# Patient Record
Sex: Female | Born: 1960 | Race: White | Hispanic: No | Marital: Single | Smoking: Former smoker
Health system: Southern US, Community
[De-identification: ages and names within clinical notes are randomized; demographics above are authoritative.]

## PROBLEM LIST (undated history)

## (undated) DIAGNOSIS — I10 Essential (primary) hypertension: Secondary | ICD-10-CM

## (undated) HISTORY — PX: CHOLECYSTECTOMY: SHX55

---

## 1997-11-03 ENCOUNTER — Emergency Department (HOSPITAL_COMMUNITY): Admission: EM | Admit: 1997-11-03 | Discharge: 1997-11-03 | Payer: Self-pay | Admitting: Emergency Medicine

## 1998-09-29 ENCOUNTER — Emergency Department (HOSPITAL_COMMUNITY): Admission: EM | Admit: 1998-09-29 | Discharge: 1998-09-29 | Payer: Self-pay | Admitting: Emergency Medicine

## 1998-10-04 ENCOUNTER — Other Ambulatory Visit: Admission: RE | Admit: 1998-10-04 | Discharge: 1998-10-04 | Payer: Self-pay | Admitting: Obstetrics and Gynecology

## 1998-12-16 ENCOUNTER — Emergency Department (HOSPITAL_COMMUNITY): Admission: EM | Admit: 1998-12-16 | Discharge: 1998-12-16 | Payer: Self-pay | Admitting: Emergency Medicine

## 1999-01-01 ENCOUNTER — Ambulatory Visit (HOSPITAL_COMMUNITY): Admission: RE | Admit: 1999-01-01 | Discharge: 1999-01-01 | Payer: Self-pay | Admitting: Gastroenterology

## 1999-01-16 ENCOUNTER — Observation Stay (HOSPITAL_COMMUNITY): Admission: RE | Admit: 1999-01-16 | Discharge: 1999-01-17 | Payer: Self-pay | Admitting: General Surgery

## 2000-04-08 ENCOUNTER — Other Ambulatory Visit: Admission: RE | Admit: 2000-04-08 | Discharge: 2000-04-08 | Payer: Self-pay | Admitting: Gynecology

## 2002-04-06 ENCOUNTER — Other Ambulatory Visit: Admission: RE | Admit: 2002-04-06 | Discharge: 2002-04-06 | Payer: Self-pay | Admitting: Gynecology

## 2003-04-15 ENCOUNTER — Ambulatory Visit (HOSPITAL_BASED_OUTPATIENT_CLINIC_OR_DEPARTMENT_OTHER): Admission: RE | Admit: 2003-04-15 | Discharge: 2003-04-15 | Payer: Self-pay | Admitting: Gynecology

## 2003-04-15 ENCOUNTER — Ambulatory Visit (HOSPITAL_COMMUNITY): Admission: RE | Admit: 2003-04-15 | Discharge: 2003-04-15 | Payer: Self-pay | Admitting: Gynecology

## 2005-07-22 ENCOUNTER — Other Ambulatory Visit: Admission: RE | Admit: 2005-07-22 | Discharge: 2005-07-22 | Payer: Self-pay | Admitting: Gynecology

## 2005-11-20 ENCOUNTER — Emergency Department (HOSPITAL_COMMUNITY): Admission: EM | Admit: 2005-11-20 | Discharge: 2005-11-20 | Payer: Self-pay | Admitting: Emergency Medicine

## 2010-12-23 ENCOUNTER — Emergency Department: Payer: Self-pay | Admitting: Internal Medicine

## 2011-03-30 ENCOUNTER — Other Ambulatory Visit: Payer: Self-pay | Admitting: Internal Medicine

## 2011-03-30 ENCOUNTER — Other Ambulatory Visit: Payer: Self-pay | Admitting: Physician Assistant

## 2011-07-01 ENCOUNTER — Other Ambulatory Visit: Payer: Self-pay | Admitting: Physician Assistant

## 2011-07-02 ENCOUNTER — Other Ambulatory Visit: Payer: Self-pay | Admitting: Physician Assistant

## 2011-10-31 ENCOUNTER — Telehealth: Payer: Self-pay

## 2011-10-31 NOTE — Telephone Encounter (Signed)
Called patient she is advised to have pharmacy send over the request to ensure correct meds are sent for her.  If she needs anything else, she will call me

## 2011-10-31 NOTE — Telephone Encounter (Signed)
[phone]   Pt is unemployed, no insurance and would like for Dr. Merla Riches to prescribe her refills on PARoxetine (PAXIL) 20 MG tablet simvastatin (ZOCOR) 40 MG tablet And hydrocloize   [facility] - [place]

## 2013-07-28 ENCOUNTER — Ambulatory Visit: Payer: Self-pay | Admitting: Internal Medicine

## 2014-05-22 NOTE — Consult Note (Signed)
PATIENT NAME:  Hailey DeltonMOSER, BECKY MR#:  [id number] DATE OF BIRTH:  1960-12-21  DATE OF CONSULTATION:  12/23/2010  REFERRING PHYSICIAN:  Dr. Mindi JunkerGottlieb  CONSULTING PHYSICIAN:  Winn JockJames C. Jesus Nevills, MD  REASON FOR CONSULTATION: The patient is a 54 year old female with pain, swelling, and possible bite to her right upper extremity.   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 She developed rather rapid pain and swelling with some nausea. She presented to the Emergency Room. She denied any prior such issues. She's been fairly comfortable in the Emergency Room after given IV Solu-Medrol.   Her nausea has also resolved with Zofran.   She denies any numbness or tingling in the hand. Pain is primarily in the dorsum of the hand with some swelling in the forearm.   PAST MEDICAL HISTORY: Hypertension.   DRUG ALLERGIES: None.   MEDICATIONS:  1. Hydrochlorothiazide.  2. Xanax.  REVIEW OF SYSTEMS: Unremarkable for any fevers, chills, or constitutional symptoms except as noted above. No bowel or bladder symptoms.   SOCIAL HISTORY: The patient drinks wine. Does not smoke. No illicit drugs.   FAMILY HISTORY: Noncontributory.   PHYSICAL EXAMINATION: The patient is alert and oriented with arm hanging in a fingertrap type device. There is swelling throughout the hand but more so on the dorsum and in the thenar area. The webspace between the thumb and index finger reveals moderate swelling. There is what appears to be a small puncture wound with minimal erythema. There is mild swelling in the forearm with tenderness. Passive range of motion causes mild pain. Active range of motion is more painful. Neurovascular examination is intact. Pulses are intact. No axillary nodes. No epitrochlear nodes. Shoulder and elbow have good range of motion. Neck is supple.   LABORATORY, DIAGNOSTIC,  AND RADIOLOGICAL DATA: Laboratory results are reviewed. Hemogram revealed a white blood cell count of 14.4.   CLINICAL IMPRESSION: Insect bite versus snakebite to right hand. The patient is feeling better. Swelling has not progressed. She desires to go home. She understands to return if she has any increased pain, swelling, numbness, or tingling. She will be given IV Toradol prior to discharge and prescription for Toradol. Will take Tylenol. Clear discharge instructions were given. A modified sling is prepared for her to keep the arm elevated. She will call if she has any questions. She is given my card. She understands if this is to worsen she is to return to the Emergency Room as soon as possible.   ____________________________ Winn JockJames C. Gerrit Heckaliff, MD jcc:drc D: 12/23/2010 21:34:42 ET T: 12/24/2010 08:25:47 ET JOB#: [id number]  cc: Winn JockJames C. Gerrit Heckaliff, MD, <Dictator> Winn JockJAMES C Deshan Hemmelgarn MD ELECTRONICALLY SIGNED 02/14/2011 16:00

## 2015-02-21 ENCOUNTER — Encounter (HOSPITAL_COMMUNITY): Payer: Self-pay | Admitting: Emergency Medicine

## 2015-02-21 ENCOUNTER — Emergency Department (HOSPITAL_COMMUNITY)
Admission: EM | Admit: 2015-02-21 | Discharge: 2015-02-21 | Disposition: A | Discharge status: Home / Self Care | Payer: BLUE CROSS/BLUE SHIELD | Attending: Emergency Medicine | Admitting: Emergency Medicine

## 2015-02-21 ENCOUNTER — Emergency Department (HOSPITAL_COMMUNITY): Payer: BLUE CROSS/BLUE SHIELD

## 2015-02-21 DIAGNOSIS — R202 Paresthesia of skin: Secondary | ICD-10-CM | POA: Diagnosis not present

## 2015-02-21 DIAGNOSIS — Z79899 Other long term (current) drug therapy: Secondary | ICD-10-CM | POA: Diagnosis not present

## 2015-02-21 DIAGNOSIS — R0789 Other chest pain: Secondary | ICD-10-CM | POA: Diagnosis not present

## 2015-02-21 DIAGNOSIS — I10 Essential (primary) hypertension: Secondary | ICD-10-CM | POA: Insufficient documentation

## 2015-02-21 DIAGNOSIS — R079 Chest pain, unspecified: Secondary | ICD-10-CM | POA: Diagnosis present

## 2015-02-21 HISTORY — DX: Essential (primary) hypertension: I10

## 2015-02-21 LAB — I-STAT TROPONIN, ED: Troponin i, poc: 0.01 ng/mL (ref 0.00–0.08)

## 2015-02-21 LAB — CBC
HCT: 39 % (ref 36.0–46.0)
Hemoglobin: 13.4 g/dL (ref 12.0–15.0)
MCH: 31.1 pg (ref 26.0–34.0)
MCHC: 34.4 g/dL (ref 30.0–36.0)
MCV: 90.5 fL (ref 78.0–100.0)
Platelets: 269 10*3/uL (ref 150–400)
RBC: 4.31 MIL/uL (ref 3.87–5.11)
RDW: 12.9 % (ref 11.5–15.5)
WBC: 5.4 10*3/uL (ref 4.0–10.5)

## 2015-02-21 LAB — BASIC METABOLIC PANEL
Anion gap: 10 (ref 5–15)
BUN: 14 mg/dL (ref 6–20)
CO2: 26 mmol/L (ref 22–32)
Calcium: 9.9 mg/dL (ref 8.9–10.3)
Chloride: 104 mmol/L (ref 101–111)
Creatinine, Ser: 0.63 mg/dL (ref 0.44–1.00)
GFR calc Af Amer: >60 mL/min (ref >60)
GFR calc non Af Amer: >60 mL/min (ref >60)
Glucose, Bld: 99 mg/dL (ref 65–99)
Potassium: 3.8 mmol/L (ref 3.5–5.1)
Sodium: 140 mmol/L (ref 135–145)

## 2015-02-21 NOTE — ED Provider Notes (Signed)
CSN: [id number]     Arrival date & time 02/21/15  1121 History   First MD Initiated Contact with Patient 02/21/15 1533     Chief Complaint  Patient presents with  . Chest Pain     (Consider location/radiation/quality/duration/timing/severity/associated sxs/prior Treatment) HPI   55 year old female with intermittent paresthesias to bilateral upper extremities. Right more than left. Typically from right elbow down into the hand and also in left shoulder distally. Happens intermittently. Seems to notice more at night when laying in bed. Describes pins and needle sensation and that feels "asleep." Denies any neck or back pain. No weakness. Symptoms have been ongoing for the last week or so. 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 She has worked here for approximately past 2-1/2 years. She denies any acute trauma or change in activity. Also describes intermittent tightness around her epigastrium/lower chest. She feels like something is wrapped circumferentially around her and squeezing. Episodes last a few minutes. She has not noticed any appreciable exacerbating relieving factors with regards to this either.  Maybe notices it more at night when in bed? When this happens she feels like she cannot get a deep breath. No cough. No fevers or chills. No unusual leg pain or swelling. Hx of anxiety but says that these are not typical symptoms for her. Increased stress particularly husband with stroke.   Past Medical History  Diagnosis Date  . Hypertension    Past Surgical History  Procedure Laterality Date  . Cholecystectomy     No family history on file. Social History  Substance Use Topics  . Smoking status: Never Smoker   . Smokeless tobacco: None  . Alcohol Use: No   OB History    No data available     Review of Systems  All systems reviewed and negative, other than as noted in HPI.   Allergies  Review of patient's allergies indicates not on  file.  Home Medications   Prior to Admission medications   Medication Sig Start Date End Date Taking? Authorizing Provider  ALPRAZolam Prudy Feeler) 0.5 MG tablet TAKE ONE TABLET BY MOUTH AT BEDTIME AS NEEDED 07/02/11   Pattricia Boss, PA-C  PARoxetine (PAXIL) 20 MG tablet TAKE ONE TABLET BY MOUTH AT BEDTIME 07/01/11   Pattricia Boss, PA-C  simvastatin (ZOCOR) 40 MG tablet TAKE ONE TABLET BY MOUTH EVERY DAY 07/01/11   Pattricia Boss, PA-C   BP 151/78 mmHg  Pulse 65  Temp(Src) 97.8 F (36.6 C) (Oral)  Resp 18  Ht  (1.626 m)  Wt 140 lb (63.504 kg)  BMI 24.02 kg/m2  SpO2 100% Physical Exam  Constitutional: She is oriented to person, place, and time. She appears well-developed and well-nourished. No distress.  HENT:  Head: Normocephalic and atraumatic.  Eyes: Conjunctivae are normal. Right eye exhibits no discharge. Left eye exhibits no discharge.  Neck: Neck supple.  Cardiovascular: Normal rate, regular rhythm and normal heart sounds.  Exam reveals no gallop and no friction rub.   No murmur heard. Pulmonary/Chest: Effort normal and breath sounds normal. No respiratory distress.  Abdominal: Soft. She exhibits no distension. There is no tenderness.  Musculoskeletal: She exhibits no edema or tenderness.  Lower extremities symmetric as compared to each other. No calf tenderness. Negative Homan's. No palpable cords.   Neurological: She is alert and oriented to person, place, and time. No cranial nerve deficit. She exhibits normal muscle tone. Coordination normal.  Cranial nerves II through XII are intact.  Strength is 5 out of 5 bilateral upper extremities. Sensation is intact to light touch. Normal range of motion at the wrists, elbows and shoulder without any apparent discomfort. No rash, swelling or concerning lesions.  Skin: Skin is warm and dry.  Psychiatric: She has a normal mood and affect. Her behavior is normal. Thought content normal.  Nursing note and vitals reviewed.   ED  Course  Procedures (including critical care time) Labs Review Labs Reviewed  BASIC METABOLIC PANEL  CBC  I-STAT TROPOININ, ED    Imaging Review Dg Chest 2 View  02/21/2015  CLINICAL DATA:  Left shoulder pain and tightness in the chest since Saturday. EXAM: CHEST  2 VIEW COMPARISON:  11/20/2005 FINDINGS: Mild chronic enlargement of the cardiopericardial silhouette. No edema. The lungs appear clear. Deformity favoring healed fracture of the right lateral eighth rib. Thoracic spondylosis.  No pleural effusion. IMPRESSION: 1. Mild chronic enlargement of the cardiopericardial silhouette, without edema. The lungs appear clear. 2. Chronic deformity of the right lateral eighth rib favoring healed fracture. 3. Thoracic spondylosis. Electronically Signed   By: Gaylyn Rong M.D.   On: 02/21/2015 13:06   I have personally reviewed and evaluated these images and lab results as part of my medical decision-making.   EKG Interpretation   Date/Time:  Tuesday February 21 2015 11:25:09 EST Ventricular Rate:  65 PR Interval:  166 QRS Duration: 84 QT Interval:  366 QTC Calculation: 380 R Axis:   52 Text Interpretation:  Normal sinus rhythm Normal ECG No old tracing to  compare Confirmed by Judea Fennimore  MD, Devita Nies ([id number]) on 02/21/2015 3:38:16 PM      MDM   Final diagnoses:  Paresthesia of upper extremity  Chest tightness or pressure    54 with intermittent paresthesias of bilateral upper extremities. Suspect peripheral etiology. Doubt central cause. Neuro exam is nonfocal. 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 Also describes intermittent tightness around epigastrium/lower chest. Atypical for ACS. Actually notices symptoms more while at rest. My suspicion for ACS or other emergent etiology is low. Has hx of HTN, HLD and middle aged though. Recommend further discussion with PCP in terms of possible stress testing.    Raeford Razor, MD 02/21/15 [phone]

## 2015-02-21 NOTE — Discharge Instructions (Signed)
Paresthesia Paresthesia is an abnormal burning or prickling sensation. This sensation is generally felt in the hands, arms, legs, or feet. However, it may occur in any part of the body. Usually, it is not painful. The feeling may be described as:  Tingling or numbness.  Pins and needles.  Skin crawling.  Buzzing.  Limbs falling asleep.  Itching. Most people experience temporary (transient) paresthesia at some time in their lives. Paresthesia may occur when you breathe too quickly (hyperventilation). It can also occur without any apparent cause. Commonly, paresthesia occurs when pressure is placed on a nerve. The sensation quickly goes away after the pressure is removed. For some people, however, paresthesia is a long-lasting (chronic) condition that is caused by an underlying disorder. If you continue to have paresthesia, you may need further medical evaluation. HOME CARE INSTRUCTIONS Watch your condition for any changes. Taking the following actions may help to lessen any discomfort that you are feeling:  Avoid drinking alcohol.  Try acupuncture or massage to help relieve your symptoms.  Keep all follow-up visits as directed by your health care provider. This is important. SEEK MEDICAL CARE IF:  You continue to have episodes of paresthesia.  Your burning or prickling feeling gets worse when you walk.  You have pain, cramps, or dizziness.  You develop a rash. SEEK IMMEDIATE MEDICAL CARE IF:  You feel weak.  You have trouble walking or moving.  You have problems with speech, understanding, or vision.  You feel confused.  You cannot control your bladder or bowel movements.  You have numbness after an injury.  You faint.   This information is not intended to replace advice given to you by your health care provider. Make sure you discuss any questions you have with your health care provider.   Document Released: 01/04/2002 Document Revised: 05/31/2014 Document Reviewed:  01/10/2014 Elsevier Interactive Patient Education 2016 Elsevier Inc.  Nonspecific Chest Pain  Chest pain can be caused by many different conditions. There is always a chance that your pain could be related to something serious, such as a heart attack or a blood clot in your lungs. Chest pain can also be caused by conditions that are not life-threatening. If you have chest pain, it is very important to follow up with your health care provider. CAUSES  Chest pain can be caused by:  Heartburn.  Pneumonia or bronchitis.  Anxiety or stress.  Inflammation around your heart (pericarditis) or lung (pleuritis or pleurisy).  A blood clot in your lung.  A collapsed lung (pneumothorax). It can develop suddenly on its own (spontaneous pneumothorax) or from trauma to the chest.  Shingles infection (varicella-zoster virus).  Heart attack.  Damage to the bones, muscles, and cartilage that make up your chest wall. This can include:  Bruised bones due to injury.  Strained muscles or cartilage due to frequent or repeated coughing or overwork.  Fracture to one or more ribs.  Sore cartilage due to inflammation (costochondritis). RISK FACTORS  Risk factors for chest pain may include:  Activities that increase your risk for trauma or injury to your chest.  Respiratory infections or conditions that cause frequent coughing.  Medical conditions or overeating that can cause heartburn.  Heart disease or family history of heart disease.  Conditions or health behaviors that increase your risk of developing a blood clot.  Having had chicken pox (varicella zoster). SIGNS AND SYMPTOMS Chest pain can feel like:  Burning or tingling on the surface of your chest or deep in your  chest.  Crushing, pressure, aching, or squeezing pain.  Dull or sharp pain that is worse when you move, cough, or take a deep breath.  Pain that is also felt in your back, neck, shoulder, or arm, or pain that spreads to  any of these areas. Your chest pain may come and go, or it may stay constant. DIAGNOSIS Lab tests or other studies may be needed to find the cause of your pain. Your health care provider may have you take a test called an ambulatory ECG (electrocardiogram). An ECG records your heartbeat patterns at the time the test is performed. You may also have other tests, such as:  Transthoracic echocardiogram (TTE). During echocardiography, sound waves are used to create a picture of all of the heart structures and to look at how blood flows through your heart.  Transesophageal echocardiogram (TEE).This is a more advanced imaging test that obtains images from inside your body. It allows your health care provider to see your heart in finer detail.  Cardiac monitoring. This allows your health care provider to monitor your heart rate and rhythm in real time.  Holter monitor. This is a portable device that records your heartbeat and can help to diagnose abnormal heartbeats. It allows your health care provider to track your heart activity for several days, if needed.  Stress tests. These can be done through exercise or by taking medicine that makes your heart beat more quickly.  Blood tests.  Imaging tests. TREATMENT  Your treatment depends on what is causing your chest pain. Treatment may include:  Medicines. These may include:  Acid blockers for heartburn.  Anti-inflammatory medicine.  Pain medicine for inflammatory conditions.  Antibiotic medicine, if an infection is present.  Medicines to dissolve blood clots.  Medicines to treat coronary artery disease.  Supportive care for conditions that do not require medicines. This may include:  Resting.  Applying heat or cold packs to injured areas.  Limiting activities until pain decreases. HOME CARE INSTRUCTIONS  If you were prescribed an antibiotic medicine, finish it all even if you start to feel better.  Avoid any activities that bring  on chest pain.  Do not use any tobacco products, including cigarettes, chewing tobacco, or electronic cigarettes. If you need help quitting, ask your health care provider.  Do not drink alcohol.  Take medicines only as directed by your health care provider.  Keep all follow-up visits as directed by your health care provider. This is important. This includes any further testing if your chest pain does not go away.  If heartburn is the cause for your chest pain, you may be told to keep your head raised (elevated) while sleeping. This reduces the chance that acid will go from your stomach into your esophagus.  Make lifestyle changes as directed by your health care provider. These may include:  Getting regular exercise. Ask your health care provider to suggest some activities that are safe for you.  Eating a heart-healthy diet. A registered dietitian can help you to learn healthy eating options.  Maintaining a healthy weight.  Managing diabetes, if necessary.  Reducing stress. SEEK MEDICAL CARE IF:  Your chest pain does not go away after treatment.  You have a rash with blisters on your chest.  You have a fever. SEEK IMMEDIATE MEDICAL CARE IF:   Your chest pain is worse.  You have an increasing cough, or you cough up blood.  You have severe abdominal pain.  You have severe weakness.  You faint.  You have chills.  You have sudden, unexplained chest discomfort.  You have sudden, unexplained discomfort in your arms, back, neck, or jaw.  You have shortness of breath at any time.  You suddenly start to sweat, or your skin gets clammy.  You feel nauseous or you vomit.  You suddenly feel light-headed or dizzy.  Your heart begins to beat quickly, or it feels like it is skipping beats. These symptoms may represent a serious problem that is an emergency. Do not wait to see if the symptoms will go away. Get medical help right away. Call your local emergency services (911 in  the [place] Do not drive yourself to the hospital.   This information is not intended to replace advice given to you by your health care provider. Make sure you discuss any questions you have with your health care provider.   Document Released: 10/24/2004 Document Revised: 02/04/2014 Document Reviewed: 08/20/2013 Elsevier Interactive Patient Education [employer]

## 2015-02-21 NOTE — ED Notes (Signed)
C/o chest pain and SOB X several days, no other complaints, A/O X4, ambulatory and in NAD

## 2015-03-01 ENCOUNTER — Inpatient Hospital Stay: Payer: Self-pay | Admitting: Family Medicine

## 2015-03-22 ENCOUNTER — Encounter: Payer: Self-pay | Admitting: Family Medicine

## 2015-03-22 ENCOUNTER — Ambulatory Visit (INDEPENDENT_AMBULATORY_CARE_PROVIDER_SITE_OTHER): Payer: BLUE CROSS/BLUE SHIELD | Admitting: Family Medicine

## 2015-03-22 VITALS — BP 126/79 | HR 63 | Temp 97.8°F | Resp 16 | Ht 63.0 in | Wt 152.0 lb

## 2015-03-22 DIAGNOSIS — F32A Depression, unspecified: Secondary | ICD-10-CM

## 2015-03-22 DIAGNOSIS — M791 Myalgia, unspecified site: Secondary | ICD-10-CM

## 2015-03-22 DIAGNOSIS — M7989 Other specified soft tissue disorders: Secondary | ICD-10-CM

## 2015-03-22 DIAGNOSIS — I10 Essential (primary) hypertension: Secondary | ICD-10-CM | POA: Diagnosis not present

## 2015-03-22 DIAGNOSIS — R232 Flushing: Secondary | ICD-10-CM

## 2015-03-22 DIAGNOSIS — F329 Major depressive disorder, single episode, unspecified: Secondary | ICD-10-CM | POA: Diagnosis not present

## 2015-03-22 DIAGNOSIS — Z23 Encounter for immunization: Secondary | ICD-10-CM

## 2015-03-22 MED ORDER — PAROXETINE HCL 20 MG PO TABS: 20.0000 mg | ORAL_TABLET | Freq: Every day | ORAL | Status: AC

## 2015-03-22 MED ORDER — HYDROCHLOROTHIAZIDE 12.5 MG PO CAPS: 12.5000 mg | ORAL_CAPSULE | Freq: Every day | ORAL | Status: DC

## 2015-03-22 NOTE — Progress Notes (Signed)
Subjective:    Patient ID: Hailey Bush, female    DOB: 1960/10/16, 55 y.o.   MRN: [id number]  HPI This is a pleasant 55 yo female who presents today for follow up of ED visit from 02/21/15. She was seen for arm pain/numbness. She has not had any more symptoms. Normal EKG/chest Xray/treponin. 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 Work schedule can be up to 14 hours a night. She is not up to date on health maintenance but is interested in having more regular care.    Over the last 6 months has had intermittent periods of flushing over bridge of nose. Does not coincide with hot flashes. Has noticed several times a month. Has had more muscle and joint aches. "bones hurt." Right hand swelling with work. Takes Advil, 2 tablets several times a week with some relief.   Has noticed hot flashes, night sweats, mood swings and sleep disturbance for about 1 year. Was on Xanax which made her feel groggy in the mornings. Has had increased stress with her husband's recent stroke, then he lost his job. She is not happy in her job and plans to look for a different job once her husband gets a new job. She has been taking Paxil, but not very regularly. Needs refill, thinks this will help her once she is taking regularly.   Past Medical History  Diagnosis Date  . Hypertension    Past Surgical History  Procedure Laterality Date  . Cholecystectomy     No family history on file. Social History  Substance Use Topics  . Smoking status: Former Smoker -- 8 years    Types: Cigarettes    Quit date: 04/29/2014  . Smokeless tobacco: None     Comment: smoked only 2 per day  . Alcohol Use: No   Review of Systems  Constitutional: Positive for fatigue. Negative for fever.  Respiratory: Negative for cough, chest tightness, shortness of breath and wheezing.   Cardiovascular: Negative for chest pain, palpitations and leg swelling.  Musculoskeletal: Positive for myalgias,  joint swelling and arthralgias.  Psychiatric/Behavioral: The patient is nervous/anxious.        Objective:   Physical Exam Physical Exam  Constitutional: Oriented to person, place, and time. She appears well-developed and well-nourished.  HENT:  Head: Normocephalic and atraumatic.  Eyes: Conjunctivae are normal.  Neck: Normal range of motion. Neck supple.  Cardiovascular: Normal rate, regular rhythm and normal heart sounds.   Pulmonary/Chest: Effort normal and breath sounds normal.  Musculoskeletal: Normal range of motion. No edema or erythema of hands. Normal strength.  Neurological: Alert and oriented to person, place, and time.  Skin: Skin is warm and dry.  Psychiatric: Normal mood and affect. Behavior is normal. Judgment and thought content normal.  Vitals reviewed.  BP 126/79 mmHg  Pulse 63  Temp(Src) 97.8 F (36.6 C) (Oral)  Resp 16  Ht  (1.6 m)  Wt 152 lb (68.947 kg)  BMI 26.93 kg/m2  SpO2 99% Wt Readings from Last 3 Encounters:  03/22/15 152 lb (68.947 kg)  02/21/15 140 lb (63.504 kg)       Assessment & Plan:  1. Need for prophylactic vaccination and inoculation against influenza - Flu Vaccine QUAD 36+ mos IM  2. Facial flushing - ANA  3. Myalgia - ANA - Sedimentation Rate - Rheumatoid factor  4. Swelling of right hand - this is intermittent and related to tasks at work, discussed  otc bracing and occasional NSAID use  5. Depression - discussed importance for good sleep hygiene, suggested she try melatonin for two weeks - PARoxetine (PAXIL) 20 MG tablet; Take 1 tablet (20 mg total) by mouth at bedtime.  Dispense: 90 tablet; Refill: 3  6. Essential hypertension - hydrochlorothiazide (MICROZIDE) 12.5 MG capsule; Take 1 capsule (12.5 mg total) by mouth daily.  Dispense: 90 capsule; Refill: 3  - she will follow up in 2-3 months for CPE  Olean Ree, FNP-BC  [facility] [place], [facility]  03/25/2015 9:56 AM

## 2015-03-22 NOTE — Patient Instructions (Signed)
We recommend that you schedule a mammogram for breast cancer screening. Typically, you do not need a referral to do this. Please contact a local imaging center to schedule your mammogram.  [facility] - [phone]  *ask for the Radiology Department [facility]) - [phone] or [phone]  [facility] - [phone] [facility] - [phone] [facility] - [phone]  *ask for the Radiology Department [facility] - [phone]  *ask for the Radiology Department [facility] - [phone]  *ask for the Mammography Department [facility] - [phone]   Try melatonin for sleep

## 2015-03-23 LAB — SEDIMENTATION RATE: SED RATE: 1 mm/h (ref 0–30)

## 2015-03-23 LAB — RHEUMATOID FACTOR

## 2015-03-23 LAB — ANA: Anti Nuclear Antibody(ANA): NEGATIVE

## 2016-06-05 ENCOUNTER — Ambulatory Visit (INDEPENDENT_AMBULATORY_CARE_PROVIDER_SITE_OTHER): Payer: 59 | Admitting: Family Medicine

## 2016-06-05 ENCOUNTER — Encounter: Payer: Self-pay | Admitting: Family Medicine

## 2016-06-05 VITALS — BP 126/78 | HR 72 | Temp 98.6°F | Resp 17 | Ht 63.5 in | Wt 165.0 lb

## 2016-06-05 DIAGNOSIS — R1013 Epigastric pain: Secondary | ICD-10-CM

## 2016-06-05 DIAGNOSIS — R195 Other fecal abnormalities: Secondary | ICD-10-CM | POA: Diagnosis not present

## 2016-06-05 LAB — HEMOCCULT GUIAC POC 1CARD (OFFICE): FECAL OCCULT BLD: NEGATIVE

## 2016-06-05 MED ORDER — PANTOPRAZOLE SODIUM 40 MG PO TBEC: 40.0000 mg | DELAYED_RELEASE_TABLET | Freq: Every day | ORAL | 0 refills | Status: AC

## 2016-06-05 NOTE — Patient Instructions (Addendum)
Thank you so much for coming to visit today! I suspect your pain is due to an ulcer. I have sent a prescription for Protonix to the pharmacy to take once daily for four weeks. We have placed a referral to GI since you are past due on your colonoscopy. Please avoid NSAIDs, such as Aleve or Ibuprofen. Please return in 4 weeks. Please return sooner if you develop worsening abdominal pain or bloody stools.   Dr. Caroleen Hamman   Peptic Ulcer A peptic ulcer is a sore in the lining of the esophagus (esophageal ulcer), the stomach (gastric ulcer), or the first part of the small intestine (duodenal ulcer). The ulcer causes gradual wearing away (erosion) into the deeper tissue. What are the causes? Normally, the lining of the stomach and the small intestine protects itself from the acid that digests food. The protective lining can be damaged by:  An infection caused by a germ (bacterium) called Helicobacter pylori or H. pylori.  Regular use of NSAIDs, such as ibuprofen or aspirin.  Rare tumors in the stomach, small intestine, or pancreas (Zollinger-Ellison syndrome). What increases the risk? The following factors may make you more likely to develop this condition:  Smoking.  Having a family history of ulcer disease. What are the signs or symptoms? Symptoms of this condition include:  Burning pain or gnawing in the area between the chest and the belly button. The pain may be worse on an empty stomach and at night.  Heartburn.  Nausea and vomiting.  Bloating. If the ulcer results in bleeding, it can cause:  Black, tarry stools.  Vomiting of bright red blood.  Vomiting of material that looks like coffee grounds. How is this diagnosed? This condition may be diagnosed based on:  Medical history and physical exam.  Various tests or procedures, such as:  Blood tests, stool tests, or breath tests to check for the H. pylori bacterium.  An X-ray exam (upper gastrointestinal series) of the  esophagus, stomach, and small intestine.  Upper endoscopy. The health care provider examines the esophagus, stomach, and small intestine using a small flexible tube that has a video camera at the end.  Biopsy. A tissue sample is removed to be examined under a microscope. How is this treated? Treatment for this condition may include:  Eliminating the cause of the ulcer, such as smoking or the use of NSAIDs or alcohol.  Medicines to reduce the amount of acid in your digestive tract.  Antibiotic medicines, if the ulcer is caused by the H. pylori bacterium.  An upper endoscopy to treat a bleeding ulcer.  Surgery, if the bleeding is severe or if the ulcer created a hole somewhere in the digestive system. Follow these instructions at home:  Avoid alcohol and caffeine.  Do not use any tobacco products, such as cigarettes, chewing tobacco, and e-cigarettes. If you need help quitting, ask your health care provider.  Take over-the-counter and prescription medicines only as told by your health care provider. Do not use over-the-counter medicines in place of prescription medicines unless your health care provider approves.  Keep all follow-up visits as told by your health care provider. This is important. Contact a health care provider if:  Your symptoms do not improve within 7 days of starting treatment.  You have ongoing indigestion or heartburn. Get help right away if:  You have sudden, sharp, or persistent pain in your abdomen.  You have bloody or dark black, tarry stools.  You vomit blood or material that looks like coffee  grounds.  You become light-headed or you feel faint.  You become weak.  You become sweaty or clammy. This information is not intended to replace advice given to you by your health care provider. Make sure you discuss any questions you have with your health care provider. Document Released: 01/12/2000 Document Revised: 06/19/2015 Document Reviewed:  10/15/2014 Elsevier Interactive Patient Education  2017 [employer]     IF you received an x-ray today, you will receive an invoice from [facility]. Please contact [facility] at [phone] with questions or concerns regarding your invoice.   IF you received labwork today, you will receive an invoice from [facility]. Please contact [facility] at [phone] with questions or concerns regarding your invoice.   Our billing staff will not be able to assist you with questions regarding bills from these companies.  You will be contacted with the lab results as soon as they are available. The fastest way to get your results is to activate your My Chart account. Instructions are located on the last page of this paperwork. If you have not heard from us regarding the results in 2 weeks, please contact this office.

## 2016-06-05 NOTE — Progress Notes (Signed)
   Hailey PocheRebecca Facchini is a 56 y.o. female who presents [facility] today for abdominal pain.  1.  Reports 3 week history of abdominal pain. Pain located in epigastric and RUQ. Notes history of cholecystectomy. Pain is worse after meals--eats only lunch to prevent pain from occurring at night. Pain is worse with laying down. Notes occasional metallic taste in mouth. Has been using Tylenol and Pepto Bismol for pain. Has noted black stools x3 days, starting after initiating Pepto Bismol. Denies bright red blood in stool. Notes some nausea. Denies diarrhea, constipation, fevers. Has never been for colonoscopy. Notes family history of pancreatic cancer. Reports weight is stable.  ROS as above.  Pertinently, no chest pain, palpitations, SOB, Fever, Chills, Abd pain, N/V/D.   PMH reviewed. Patient is a nonsmoker.   Past Medical History:  Diagnosis Date  . Hypertension    Past Surgical History:  Procedure Laterality Date  . CHOLECYSTECTOMY      Medications reviewed. Current Outpatient Prescriptions  Medication Sig Dispense Refill  . aspirin 81 MG tablet Take 81 mg by mouth daily. Reported on 03/22/2015    . hydrochlorothiazide (MICROZIDE) 12.5 MG capsule Take 1 capsule (12.5 mg total) by mouth daily. (Patient not taking: Reported on 06/05/2016) 90 capsule 3  . PARoxetine (PAXIL) 20 MG tablet Take 1 tablet (20 mg total) by mouth at bedtime. (Patient not taking: Reported on 06/05/2016) 90 tablet 3   No current facility-administered medications for this visit.     Physical Exam:  BP 126/78   Pulse 72   Temp 98.6 F (37 C) (Oral)   Resp 17   Ht 5' 3.5" (1.613 m)   Wt 165 lb (74.8 kg)   SpO2 97%   BMI 28.77 kg/m  Gen:  Alert, cooperative patient who appears stated age in no acute distress.  Vital signs reviewed. HEENT: EOMI,  MMM Pulm:  Clear to auscultation bilaterally with good air movement.  No wheezes or rales noted.   Cardiac:  Regular rate and rhythm without murmur auscultated.   Good S1/S2. Abd:  Soft and nondistended. Tenderness in epigastric area and right upper quadrant. Positive Carnett over right upper quadrant. Good bowel sounds throughout all four quadrants.  No masses noted.  Exts: Non edematous BL  LE, warm and well perfused.   Assessment and Plan:  1.  Abdominal Pain: Suspect secondary to peptic ulcer. Also suspect some abdominal musculature component given different pain in RUQ with positive Carnetts. FOBT negative for blood today. Protonix initiated to complete 4 week course. Recommend against NSAIDs. Referral placed to GI for colonoscopy. Return in 4 weeks or sooner if abdominal pain worsens or blood noted in stool.

## 2017-12-20 IMAGING — CR DG CHEST 2V
2 series · 2 of 2 positions shown · non-contrast
Comparison: 11/20/2005

CLINICAL DATA: Left shoulder pain and tightness in the chest since
[REDACTED].

EXAM:
CHEST  2 VIEW

[chest pa]
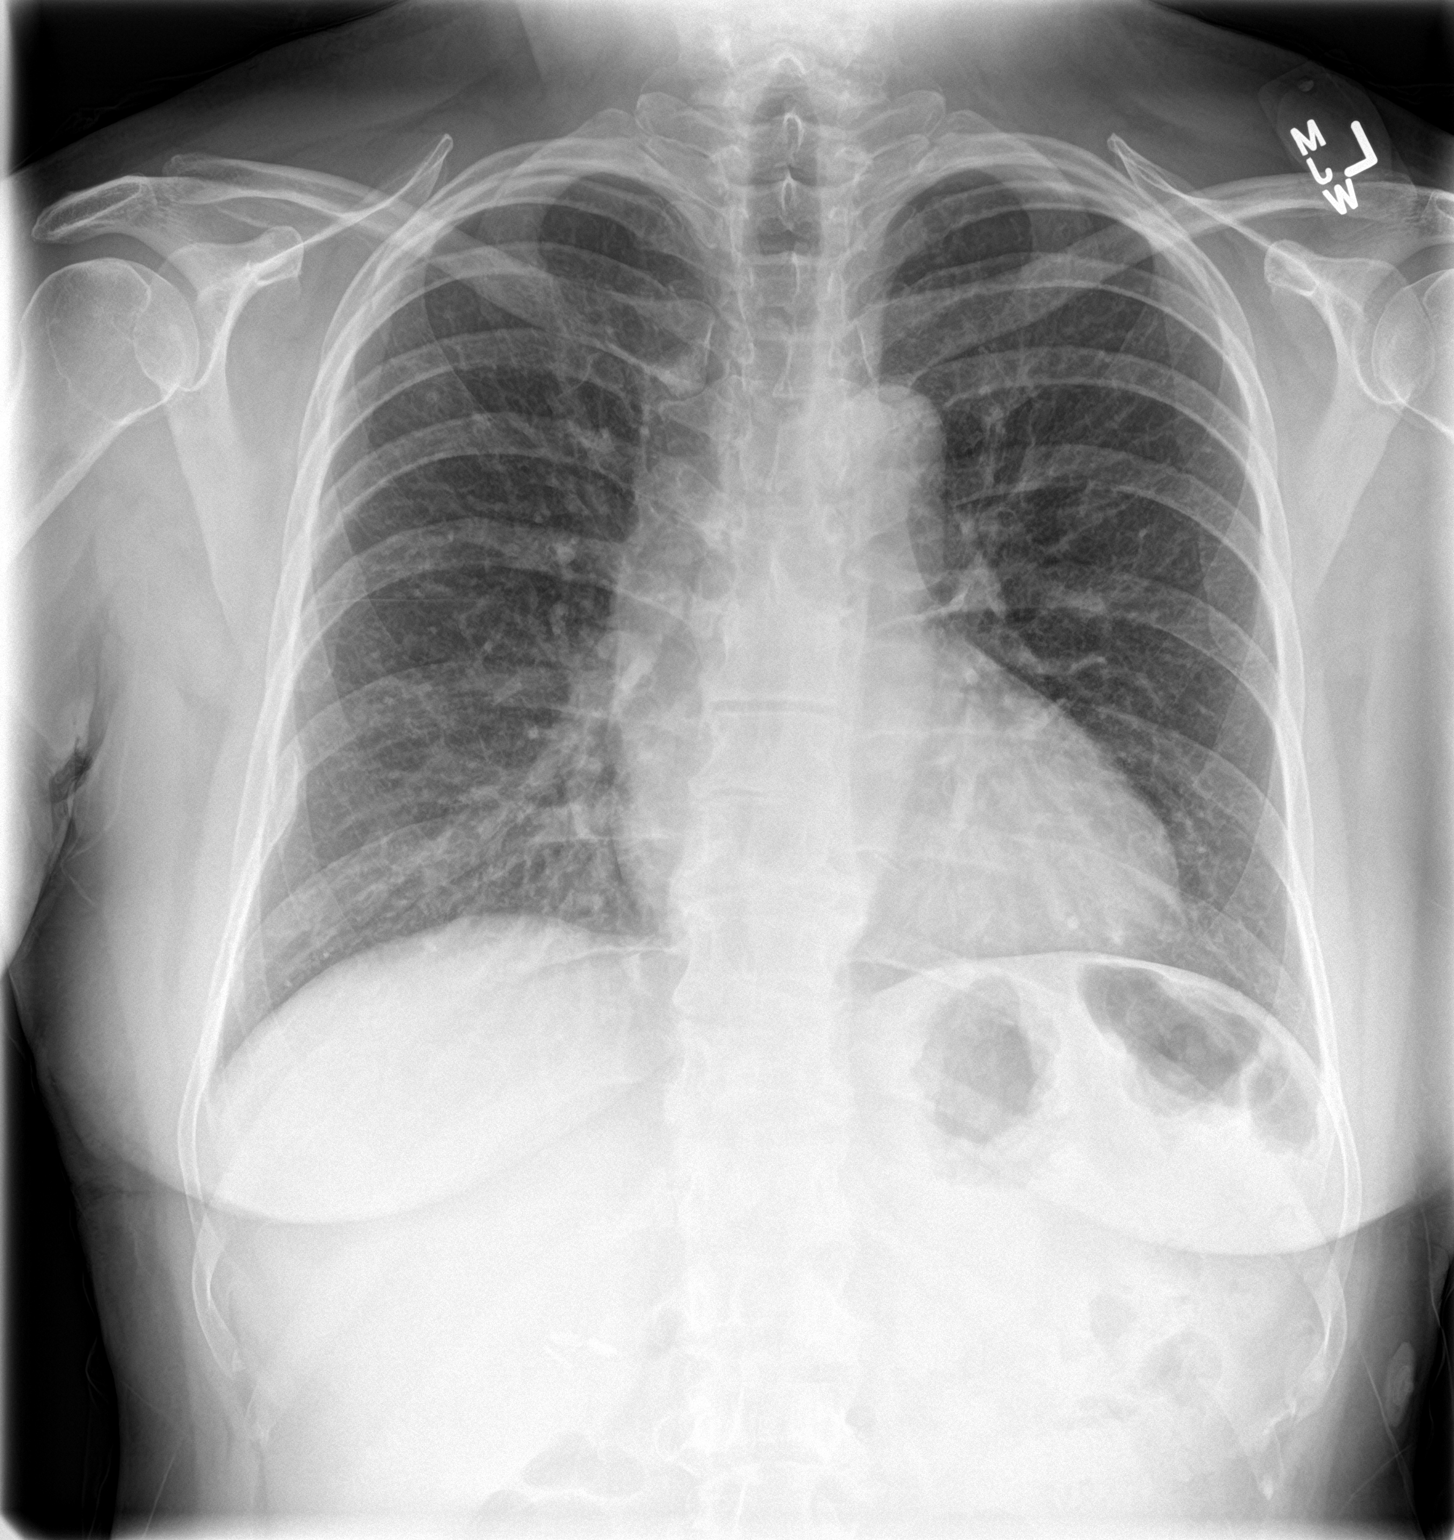

[chest lat]
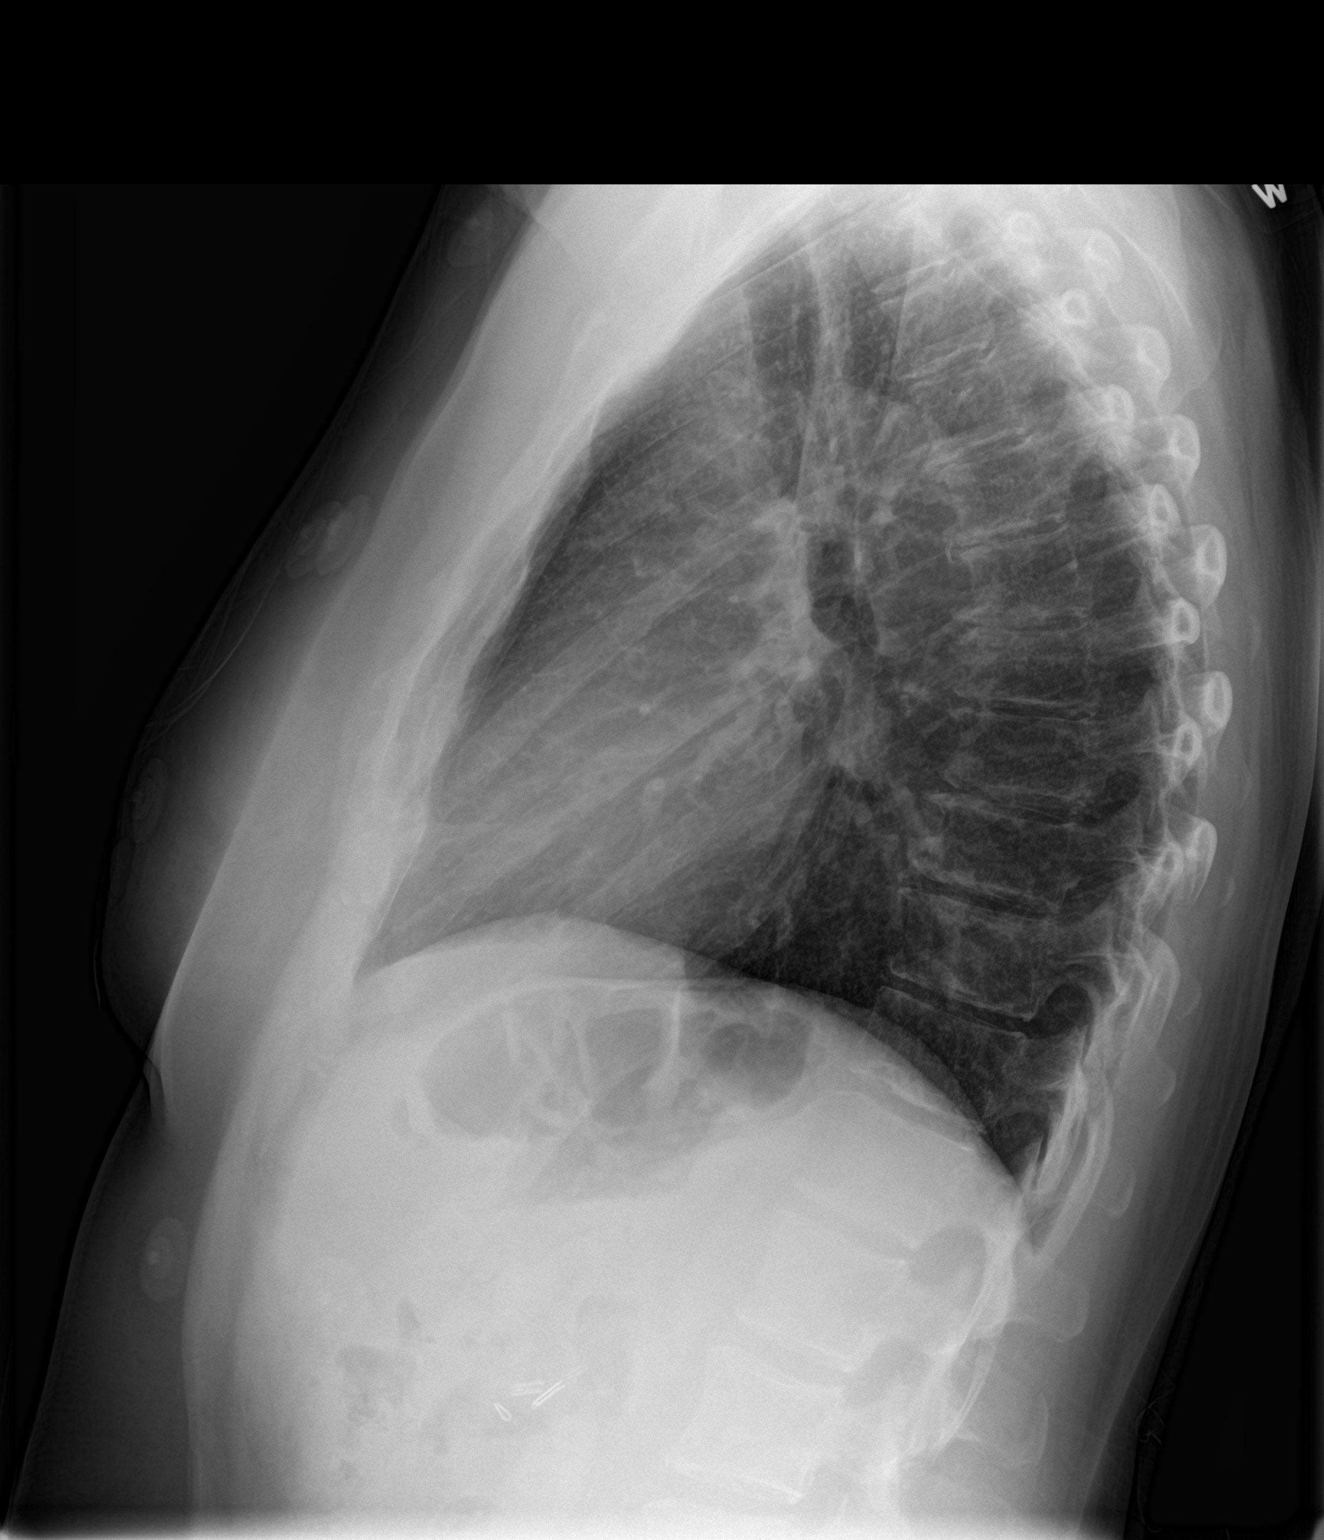

[2 of 2 positions shown; findings below may reference images not displayed]

FINDINGS: Mild chronic enlargement of the cardiopericardial silhouette. No
edema. The lungs appear clear.

Deformity favoring healed fracture of the right lateral eighth rib.

Thoracic spondylosis.  No pleural effusion.
IMPRESSION: 1. Mild chronic enlargement of the cardiopericardial silhouette,
without edema. The lungs appear clear.
2. Chronic deformity of the right lateral eighth rib favoring healed
fracture.
3. Thoracic spondylosis.

## 2018-08-18 ENCOUNTER — Other Ambulatory Visit: Payer: Self-pay | Admitting: Physician Assistant

## 2018-08-18 DIAGNOSIS — Z1231 Encounter for screening mammogram for malignant neoplasm of breast: Secondary | ICD-10-CM

## 2019-10-28 LAB — EXTERNAL GENERIC LAB PROCEDURE: COLOGUARD: NEGATIVE

## 2021-03-12 ENCOUNTER — Other Ambulatory Visit: Payer: Self-pay

## 2021-03-12 ENCOUNTER — Emergency Department: Payer: BC Managed Care – PPO

## 2021-03-12 ENCOUNTER — Emergency Department
Admission: EM | Admit: 2021-03-12 | Discharge: 2021-03-12 | Disposition: A | Discharge status: Home / Self Care | Payer: BC Managed Care – PPO | Attending: Emergency Medicine | Admitting: Emergency Medicine

## 2021-03-12 DIAGNOSIS — I10 Essential (primary) hypertension: Secondary | ICD-10-CM | POA: Diagnosis not present

## 2021-03-12 DIAGNOSIS — R079 Chest pain, unspecified: Secondary | ICD-10-CM | POA: Diagnosis present

## 2021-03-12 DIAGNOSIS — R0789 Other chest pain: Secondary | ICD-10-CM | POA: Diagnosis not present

## 2021-03-12 DIAGNOSIS — R110 Nausea: Secondary | ICD-10-CM | POA: Diagnosis not present

## 2021-03-12 LAB — CBC
HCT: 42.8 % (ref 36.0–46.0)
Hemoglobin: 14.6 g/dL (ref 12.0–15.0)
MCH: 31.9 pg (ref 26.0–34.0)
MCHC: 34.1 g/dL (ref 30.0–36.0)
MCV: 93.7 fL (ref 80.0–100.0)
Platelets: 234 10*3/uL (ref 150–400)
RBC: 4.57 MIL/uL (ref 3.87–5.11)
RDW: 12.9 % (ref 11.5–15.5)
WBC: 4.5 10*3/uL (ref 4.0–10.5)
nRBC: 0 % (ref 0.0–0.2)

## 2021-03-12 LAB — BASIC METABOLIC PANEL
Anion gap: 9 (ref 5–15)
BUN: 17 mg/dL (ref 6–20)
CO2: 26 mmol/L (ref 22–32)
Calcium: 9.4 mg/dL (ref 8.9–10.3)
Chloride: 101 mmol/L (ref 98–111)
Creatinine, Ser: 0.65 mg/dL (ref 0.44–1.00)
GFR, Estimated: >60 mL/min (ref >60)
Glucose, Bld: 102 mg/dL — ABNORMAL HIGH (ref 70–99)
Potassium: 3.8 mmol/L (ref 3.5–5.1)
Sodium: 136 mmol/L (ref 135–145)

## 2021-03-12 LAB — TROPONIN I (HIGH SENSITIVITY)
Troponin I (High Sensitivity): 3 ng/L (ref <18)
Troponin I (High Sensitivity): 6 ng/L (ref <18)

## 2021-03-12 NOTE — ED Provider Notes (Signed)
[facility] Provider Note    Event Date/Time   First MD Initiated Contact with Patient 03/12/21 1627     (approximate)   History   Chest Pain   HPI  Hailey Bush is a 61 y.o. female with a history of high blood pressure who presents with complaints of chest pain.  Patient reports earlier this morning got up from her desk and felt a sharp pain in the center of her chest without significant radiation.  It improved with rest she decided to go home for lunch to rest and felt a little bit nauseated.  Currently she is asymptomatic and has been for the last 2 hours.  No history of heart disease.  Does not smoke.  No new medications.  No calf pain or swelling.  No shortness of breath     Physical Exam   Triage Vital Signs: ED Triage Vitals  Enc Vitals Group     BP 03/12/21 1316 (!) 159/96     Pulse Rate 03/12/21 1316 71     Resp 03/12/21 1316 18     Temp 03/12/21 1316 97.7 F (36.5 C)     Temp Source 03/12/21 1316 Oral     SpO2 03/12/21 1316 97 %     Weight 03/12/21 1317 79.4 kg (175 lb)     Height 03/12/21 1317 1.6 m (5\' 3" )     Head Circumference --      Peak Flow --      Pain Score 03/12/21 1313 5     Pain Loc --      Pain Edu? --      Excl. in GC? --     Most recent vital signs: Vitals:   03/12/21 1316  BP: (!) 159/96  Pulse: 71  Resp: 18  Temp: 97.7 F (36.5 C)  SpO2: 97%     General: Awake, no distress. CV:  Good peripheral perfusion.  Regular rate and rhythm,  Resp:  Normal effort.  Clear to auscultation bilaterally. Abd:  No distention.  Other:  No calf pain or swelling, no lower extremity edema   ED Results / Procedures / Treatments   Labs (all labs ordered are listed, but only abnormal results are displayed) Labs Reviewed  BASIC METABOLIC PANEL - Abnormal; Notable for the following components:      Result Value   Glucose, Bld 102 (*)    All other components within normal limits  CBC  TROPONIN I (HIGH SENSITIVITY)   TROPONIN I (HIGH SENSITIVITY)     EKG  ED ECG REPORT I, 03/14/21, the attending physician, personally viewed and interpreted this ECG.  Date: 03/12/2021  Rhythm: normal sinus rhythm QRS Axis: normal Intervals: normal ST/T Wave abnormalities: normal Narrative Interpretation: no evidence of acute ischemia    RADIOLOGY Chest x-ray read by me, no acute abnormality    PROCEDURES:  Critical Care performed:   Procedures   MEDICATIONS ORDERED IN ED: Medications - No data to display   IMPRESSION / MDM / ASSESSMENT AND PLAN / ED COURSE  I reviewed the triage vital signs and the nursing notes.  Patient presents with chest pain as detailed above.  She does have a history of well-controlled blood pressure but otherwise no significant risk factors for ACS besides her age.  Her EKG is quite reassuring.  She is asymptomatic in the emergency department.  High sensitive troponin negative x2  CBC and CMP are normal   Discussed admission with the patient however given  that she has been asymptomatic for several hours, reassuring work-up and able to have close outpatient follow-up with cardiology appropriate for discharge at this time.  Strict return precautions discussed, patient and husband agree with this plan.          FINAL CLINICAL IMPRESSION(S) / ED DIAGNOSES   Final diagnoses:  Atypical chest pain     Rx / DC Orders   ED Discharge Orders     None        Note:  This document was prepared using Dragon voice recognition software and may include unintentional dictation errors.   Jene Every, MD 03/12/21 [phone]

## 2021-03-12 NOTE — ED Triage Notes (Signed)
Pt comes via EMs with c/o PC that started at 10 am this morning. Pt states some nausea. Pt also states neck, shoulder pain.   BP-192/104 HR-64 98% RA 324 Asprin given  20 g left arm

## 2021-03-20 ENCOUNTER — Other Ambulatory Visit: Payer: Self-pay | Admitting: Internal Medicine

## 2021-03-20 DIAGNOSIS — I208 Other forms of angina pectoris: Secondary | ICD-10-CM

## 2021-04-10 ENCOUNTER — Ambulatory Visit
Admission: RE | Admit: 2021-04-10 | Discharge: 2021-04-10 | Disposition: A | Discharge status: Home / Self Care | Payer: BLUE CROSS/BLUE SHIELD | Source: Ambulatory Visit | Attending: Internal Medicine | Admitting: Internal Medicine

## 2021-04-10 ENCOUNTER — Other Ambulatory Visit: Payer: Self-pay

## 2021-04-10 DIAGNOSIS — I208 Other forms of angina pectoris: Secondary | ICD-10-CM | POA: Insufficient documentation

## 2023-01-18 ENCOUNTER — Emergency Department (HOSPITAL_COMMUNITY): Payer: BC Managed Care – PPO

## 2023-01-18 ENCOUNTER — Emergency Department (HOSPITAL_COMMUNITY)
Admission: EM | Admit: 2023-01-18 | Discharge: 2023-01-18 | Disposition: A | Discharge status: Home / Self Care | Payer: BC Managed Care – PPO | Attending: Emergency Medicine | Admitting: Emergency Medicine

## 2023-01-18 ENCOUNTER — Encounter (HOSPITAL_COMMUNITY): Payer: Self-pay | Admitting: *Deleted

## 2023-01-18 ENCOUNTER — Other Ambulatory Visit: Payer: Self-pay

## 2023-01-18 DIAGNOSIS — Z79899 Other long term (current) drug therapy: Secondary | ICD-10-CM | POA: Insufficient documentation

## 2023-01-18 DIAGNOSIS — R6884 Jaw pain: Secondary | ICD-10-CM | POA: Diagnosis present

## 2023-01-18 DIAGNOSIS — I10 Essential (primary) hypertension: Secondary | ICD-10-CM | POA: Diagnosis not present

## 2023-01-18 DIAGNOSIS — Z7982 Long term (current) use of aspirin: Secondary | ICD-10-CM | POA: Insufficient documentation

## 2023-01-18 LAB — CBC WITH DIFFERENTIAL/PLATELET
Abs Immature Granulocytes: 0.02 10*3/uL (ref 0.00–0.07)
Basophils Absolute: 0 10*3/uL (ref 0.0–0.1)
Basophils Relative: 1 %
Eosinophils Absolute: 0.3 10*3/uL (ref 0.0–0.5)
Eosinophils Relative: 4 %
HCT: 37.4 % (ref 36.0–46.0)
Hemoglobin: 12.8 g/dL (ref 12.0–15.0)
Immature Granulocytes: 0 %
Lymphocytes Relative: 24 %
Lymphs Abs: 1.7 10*3/uL (ref 0.7–4.0)
MCH: 30.5 pg (ref 26.0–34.0)
MCHC: 34.2 g/dL (ref 30.0–36.0)
MCV: 89 fL (ref 80.0–100.0)
Monocytes Absolute: 0.8 10*3/uL (ref 0.1–1.0)
Monocytes Relative: 11 %
Neutro Abs: 4.4 10*3/uL (ref 1.7–7.7)
Neutrophils Relative %: 60 %
Platelets: 283 10*3/uL (ref 150–400)
RBC: 4.2 MIL/uL (ref 3.87–5.11)
RDW: 13.4 % (ref 11.5–15.5)
WBC: 7.2 10*3/uL (ref 4.0–10.5)
nRBC: 0 % (ref 0.0–0.2)

## 2023-01-18 LAB — BASIC METABOLIC PANEL
Anion gap: 10 (ref 5–15)
BUN: 10 mg/dL (ref 8–23)
CO2: 24 mmol/L (ref 22–32)
Calcium: 9.3 mg/dL (ref 8.9–10.3)
Chloride: 106 mmol/L (ref 98–111)
Creatinine, Ser: 0.8 mg/dL (ref 0.44–1.00)
GFR, Estimated: >60 mL/min (ref >60)
Glucose, Bld: 104 mg/dL — ABNORMAL HIGH (ref 70–99)
Potassium: 3.5 mmol/L (ref 3.5–5.1)
Sodium: 140 mmol/L (ref 135–145)

## 2023-01-18 LAB — TROPONIN I (HIGH SENSITIVITY): Troponin I (High Sensitivity): 6 ng/L (ref <18)

## 2023-01-18 MED ORDER — KETOROLAC TROMETHAMINE 30 MG/ML IJ SOLN
30.0000 mg | Freq: Once | INTRAMUSCULAR | Status: AC
Start: 2023-01-18 — End: 2023-01-18
  Administered 2023-01-18: 30 mg via INTRAVENOUS
  Filled 2023-01-18: qty 1

## 2023-01-18 MED ORDER — CLONIDINE HCL 0.1 MG PO TABS
0.1000 mg | ORAL_TABLET | Freq: Once | ORAL | Status: AC
  Administered 2023-01-18: 0.1 mg via ORAL
  Filled 2023-01-18: qty 1

## 2023-01-18 MED ORDER — OXYCODONE HCL 5 MG PO TABS: 5.0000 mg | ORAL_TABLET | Freq: Four times a day (QID) | ORAL | 0 refills | Status: AC | PRN

## 2023-01-18 MED ORDER — IOHEXOL 350 MG/ML SOLN
75.0000 mL | Freq: Once | INTRAVENOUS | Status: AC | PRN
  Administered 2023-01-18: 75 mL via INTRAVENOUS

## 2023-01-18 MED ORDER — HYDROCHLOROTHIAZIDE 12.5 MG PO CAPS: 12.5000 mg | ORAL_CAPSULE | Freq: Every day | ORAL | 0 refills | Status: AC

## 2023-01-18 MED ORDER — MORPHINE SULFATE (PF) 4 MG/ML IV SOLN
4.0000 mg | Freq: Once | INTRAVENOUS | Status: AC
  Administered 2023-01-18: 4 mg via INTRAVENOUS
  Filled 2023-01-18: qty 1

## 2023-01-18 NOTE — Discharge Instructions (Addendum)
You have been seen today for your complaint of jaw pain. Your lab work was reassuring. Your imaging was reassuring. Your discharge medications include oxycodone. This is an opioid pain medication. You should only take this medication as needed for severe pain. You should not drive, operate heavy machinery or make important decisions while taking this medication. You should use alternative methods for pain relief while taking this medication including stretching, gentle range of motion, and alternating tylenol and ibuprofen. Alternate tylenol and ibuprofen for pain. You may alternate these every 4 hours. You may take up to 800 mg of ibuprofen at a time and up to 1000 mg of tylenol. Follow up with: Your primary care provider as soon as possible Please seek immediate medical care if you develop any of the following symptoms: You are unable to open your mouth. You are having trouble breathing or swallowing. You have a fever. You notice that your face, neck, or jaw is swollen. At this time there does not appear to be the presence of an emergent medical condition, however there is always the potential for conditions to change. Please read and follow the below instructions.  Do not take your medicine if  develop an itchy rash, swelling in your mouth or lips, or difficulty breathing; call 911 and seek immediate emergency medical attention if this occurs.  You may review your lab tests and imaging results in their entirety on your MyChart account.  Please discuss all results of fully with your primary care provider and other specialist at your follow-up visit.  Note: Portions of this text may have been transcribed using voice recognition software. Every effort was made to ensure accuracy; however, inadvertent computerized transcription errors may still be present.

## 2023-01-18 NOTE — ED Provider Triage Note (Signed)
Emergency Medicine Provider Triage Evaluation Note  Hailey Bush , a 62 y.o. female  was evaluated in triage.  Pt complains of swelling to her chin.  Seen at urgent care recently for same and given diclofenac but told not to take this until later today so has not taken it yet.  She was not given any antibiotics.  There was no one there to perform x-ray so she did not have any formal testing done.  States her lower teeth are sensitive but she is not having any significant pain.  She has not had any recent dental work.  No sensation of lip or tongue swelling, no difficulty swallowing.  She has not had any new foods or medications. No known allergens.  Review of Systems  Positive: Chin swelling Negative: fever  Physical Exam  BP (!) 210/104 (BP Location: Right Arm)   Pulse 65   Temp 97.6 F (36.4 C) (Oral)   Resp 18   Ht 5\' 3"  (1.6 m)   Wt 74.8 kg   SpO2 98%   BMI 29.23 kg/m  Gen:   Awake, no distress   Resp:  Normal effort  MSK:   Moves extremities without difficulty  Other:  Mild swelling noted of the chin area that does not extend into the neck, some fullness noted to inner lower lip without findings of angioedema, tongue is normal, handling secretions well, no stridor.  Medical Decision Making  Medically screening exam initiated at 4:29 AM.  Appropriate orders placed.  Hayslee Gensch was informed that the remainder of the evaluation will be completed by another provider, this initial triage assessment does not replace that evaluation, and the importance of remaining in the ED until their evaluation is complete.  Swelling isolated to chin area.  Does not seem consistent with acute angioedema as no direct lip/tongue involvement.  No known allergens or new exposures.  She is quite hypertensive in triage.  Will get labs, CT max/face.   Garlon Hatchet, PA-C 01/18/23 [phone]

## 2023-01-18 NOTE — ED Triage Notes (Signed)
C/o lower jaw pain onset 3 days ago , denies injury, states she was seen at urgent care last pm unable to dx. States its very painful to move her lower jaw

## 2023-01-18 NOTE — ED Provider Notes (Signed)
[place] EMERGENCY DEPARTMENT AT [facility] Provider Note   CSN: [id number] Arrival date & time: 01/18/23  0348     History  Chief Complaint  Patient presents with   Jaw Pain    Hailey Bush is a 62 y.o. female.  With history of hypertension not currently on any antihypertensives presenting to the ED for evaluation of jaw pain.  Symptoms began 3 days ago.  Pain is localized to the chin without radiation.  States it feels like a deep pain like it is in her bone.  She denies any fevers or chills.  She reports pain with chewing.  Denies any large.  No sore throat.  No headaches, numbness, weakness, tingling.  No chest pain or shortness of breath.  She denies any injury.  She goes to the dentist regularly.  HPI     Home Medications Prior to Admission medications   Medication Sig Start Date End Date Taking? Authorizing Provider  oxyCODONE (ROXICODONE) 5 MG immediate release tablet Take 1 tablet (5 mg total) by mouth every 6 (six) hours as needed for severe pain (pain score 7-10). 01/18/23  Yes Myalynn Lingle, Edsel Petrin, PA-C  aspirin 81 MG tablet Take 81 mg by mouth daily. Reported on 03/22/2015    [provider]  hydrochlorothiazide (MICROZIDE) 12.5 MG capsule Take 1 capsule (12.5 mg total) by mouth daily. 01/18/23   Marieli Rudy, Edsel Petrin, PA-C  pantoprazole (PROTONIX) 40 MG tablet Take 1 tablet (40 mg total) by mouth daily. 06/05/16   Rumley, Harrison N, DO  PARoxetine (PAXIL) 20 MG tablet Take 1 tablet (20 mg total) by mouth at bedtime. Patient not taking: Reported on 06/05/2016 03/22/15   Emi Belfast, FNP      Allergies    Patient has no known allergies.    Review of Systems   Review of Systems  Constitutional:        Jaw pain  All other systems reviewed and are negative.   Physical Exam Updated Vital Signs BP (!) 164/95   Pulse 61   Temp 97.9 F (36.6 C) (Oral)   Resp 13   Ht 5\' 3"  (1.6 m)   Wt 74.8 kg   SpO2 98%   BMI 29.23 kg/m  Physical  Exam Vitals and nursing note reviewed.  Constitutional:      General: She is not in acute distress.    Appearance: Normal appearance. She is well-developed. She is not ill-appearing, toxic-appearing or diaphoretic.     Comments: Resting comfortably in bed  HENT:     Head: Normocephalic and atraumatic.     Mouth/Throat:     Mouth: Mucous membranes are moist.     Pharynx: Oropharynx is clear.     Comments: Good dentition.  No dental fractures.  No gum swelling.  No loose teeth.  No TTP to the jaw.  No overlying erythema.  No submandibular induration.  Soft palate rises with phonation.  Tongue resting on the floor of the mouth.  Uvula midline.  Tonsils 0 bilaterally. Eyes:     Conjunctiva/sclera: Conjunctivae normal.  Cardiovascular:     Rate and Rhythm: Normal rate and regular rhythm.     Heart sounds: No murmur heard. Pulmonary:     Effort: Pulmonary effort is normal. No respiratory distress.     Breath sounds: Normal breath sounds.  Abdominal:     Palpations: Abdomen is soft.     Tenderness: There is no abdominal tenderness.  Musculoskeletal:  General: No swelling.     Cervical back: Neck supple.  Skin:    General: Skin is warm and dry.     Capillary Refill: Capillary refill takes less than 2 seconds.  Neurological:     Mental Status: She is alert.  Psychiatric:        Mood and Affect: Mood normal.     ED Results / Procedures / Treatments   Labs (all labs ordered are listed, but only abnormal results are displayed) Labs Reviewed  BASIC METABOLIC PANEL - Abnormal; Notable for the following components:      Result Value   Glucose, Bld 104 (*)    All other components within normal limits  CBC WITH DIFFERENTIAL/PLATELET  TROPONIN I (HIGH SENSITIVITY)    EKG EKG Interpretation Date/Time:  Saturday January 18 2023 08:45:17 EST Ventricular Rate:  58 PR Interval:  170 QRS Duration:  89 QT Interval:  398 QTC Calculation: 391 R Axis:   12  Text  Interpretation: Sinus rhythm Low voltage, precordial leads Confirmed by Alvester Chou [phone]) on 01/18/2023 8:47:17 AM  Radiology DG Chest Port 1 View Result Date: 01/18/2023 CLINICAL DATA:  Jaw pain. EXAM: PORTABLE CHEST 1 VIEW COMPARISON:  03/12/2021 FINDINGS: Unchanged cardiomediastinal contours. Increased pulmonary vascular congestion without frank interstitial edema. There is no sign of pleural effusion or consolidative changed. Visualized osseous structures appear intact. IMPRESSION: Increased pulmonary vascular congestion without frank interstitial edema. Electronically Signed   By: Signa Kell M.D.   On: 01/18/2023 08:04   CT Maxillofacial W Contrast Result Date: 01/18/2023 CLINICAL DATA:  Sublingual/submandibular abscess, chin swelling. Pain moving the lower jaw. EXAM: CT MAXILLOFACIAL WITH CONTRAST TECHNIQUE: Multidetector CT imaging of the maxillofacial structures was performed with intravenous contrast. Multiplanar CT image reconstructions were also generated. RADIATION DOSE REDUCTION: This exam was performed according to the departmental dose-optimization program which includes automated exposure control, adjustment of the mA and/or kV according to patient size and/or use of iterative reconstruction technique. CONTRAST:  75mL OMNIPAQUE IOHEXOL 350 MG/ML SOLN COMPARISON:  None Available. FINDINGS: Osseous: No fracture or mandibular dislocation. No destructive process. No detected TMJ arthropathy. Orbits: Negative. No traumatic or inflammatory finding. Sinuses: No active sinusitis. Presumed retention cyst along the floor of the maxillary sinuses. Soft tissues: Negative. Limited intracranial: Negative IMPRESSION: Unremarkable CT of the face.  No explanation for symptoms. Electronically Signed   By: Tiburcio Pea M.D.   On: 01/18/2023 06:19    Procedures Procedures    Medications Ordered in ED Medications  iohexol (OMNIPAQUE) 350 MG/ML injection 75 mL (75 mLs Intravenous Contrast  Given 01/18/23 0531)  morphine (PF) 4 MG/ML injection 4 mg (4 mg Intravenous Given 01/18/23 0838)  cloNIDine (CATAPRES) tablet 0.1 mg (0.1 mg Oral Given 01/18/23 0840)  ketorolac (TORADOL) 30 MG/ML injection 30 mg (30 mg Intravenous Given 01/18/23 0840)    ED Course/ Medical Decision Making/ A&P Clinical Course as of 01/18/23 1037  Sat Jan 18, 2023  0832 This is a 62 year old female presenting to the ED with submandibular pain.  Patient reports gradual onset of discomfort under her mandible or chin 3 days ago.  She denies any recent dental procedures.  Denies fevers or chills.  She is otherwise nontender to palpation but it is quite painful when she tries to chew.  She feels like her jaw her teeth are "not lining up".  She denies any preceding trauma or injury to the face.  She denies any chest pain or pressure.  On exam the patient is  hypertensive (which she strongly attributes to her pain), she is tearful with discomfort.  She does not have any palpable swelling or mass along the mandible or submandibular region.  She does not have any tongue elevation or brawny edema.  I do not see evidence of peridental infection.  Her blood test from triage were unremarkable, no leukocytosis.  CT maxillofacial scan did not show any emergent process.  Patient is pending an EKG and a troponin to evaluate for potential atypical ACS.  She is also be given a small dose of clonidine as well as some pain medication and reassessing her blood pressure.  At this point, if we do not see evidence of an atypical ACS presentation, I would question referred nerve pain versus a small developing sublingual gland inflammation.  Her pain pattern is not consistent with TMJ. [MT]  [phone] Discussed case with ENT Dr. Darl Pikes who recommends analgesia, follow-up with PCP [AS]  1030 EKG is unremarkable and nonischemic.  Troponin was negative with nearly 3 days of symptoms.  Have a lower suspicion for atypical ACS.  Blood pressure has improved  with pain medicine and clonidine.  Patient will be temporarily started on baseline blood pressure medication for home until she can follow-up with her PCP.  Pain medications will be provided.  But at this time she is stable for discharge [MT]    Clinical Course User Index [AS] Keanu Lesniak, Edsel Petrin, PA-C [MT] Renaye Rakers Kermit Balo, MD                                 Medical Decision Making Amount and/or Complexity of Data Reviewed Radiology: ordered.  Risk Prescription drug management.  This patient presents to the ED for concern of jaw pain, this involves an extensive number of treatment options, and is a complaint that carries with it a high risk of complications and morbidity.  The differential diagnosis includes periodontal or periapical abscess, lockjaw, tetanus, ACS  My initial workup includes labs, imaging, pain control, EKG  Additional history obtained from: Nursing notes from this visit.  I ordered, reviewed and interpreted labs which include: CBC, BMP, troponin  I ordered imaging studies including CT maxillofacial, chest x-ray I independently visualized and interpreted imaging which showed negative CT maxillofacial, normal x-ray chest I agree with the radiologist interpretation  Cardiac Monitoring:  The patient was maintained on a cardiac monitor.  I personally viewed and interpreted the cardiac monitored which showed an underlying rhythm of: NSR  Consultations Obtained:  I requested consultation with ENT Dr. Darl Pikes,  and discussed lab and imaging findings as well as pertinent plan - they recommend: Analgesia, follow-up with primary care provider, may need referral if symptom persists but no need for ENT office visit at this time  Afebrile, initially significantly hypertensive with blood pressure of 200s over 100, otherwise hemodynamically stable.  62 year old female presenting for evaluation of jaw pain.  Pain began approximately 3 days ago.  Localized to the chin.  States  it feels deep.  No pain with palpation but she does have pain with chewing.  No fevers or sore throat.  She appears well on physical exam.  Lab workup is reassuring.  CT maxillofacial is negative.  EKG without acute ischemic changes.  Troponin negative.  Chest x-ray reassuring.  Patient reported improvement in symptoms after treatment in the emergency department.  Consulted with ENT who recommends analgesia and primary care follow-up.  Blood pressure greatly improved  while in the department.  Will restart her antihypertensive.  Will send short course of opioid pain medication and educated on potential side effects.  She was encouraged to alternate Tylenol and ibuprofen at home for pain as well.  She was given return precautions.  Stable at discharge.  At this time there does not appear to be any evidence of an acute emergency medical condition and the patient appears stable for discharge with appropriate outpatient follow up. Diagnosis was discussed with patient who verbalizes understanding of care plan and is agreeable to discharge. I have discussed return precautions with patient who verbalizes understanding. Patient encouraged to follow-up with their PCP within 1 week. All questions answered.  Patient's case discussed with Dr. Renaye Rakers who agrees with plan to discharge with follow-up.   Note: Portions of this report may have been transcribed using voice recognition software. Every effort was made to ensure accuracy; however, inadvertent computerized transcription errors may still be present.         Final Clinical Impression(s) / ED Diagnoses Final diagnoses:  Jaw pain    Rx / DC Orders ED Discharge Orders          Ordered    oxyCODONE (ROXICODONE) 5 MG immediate release tablet  Every 6 hours PRN        01/18/23 1036    hydrochlorothiazide (MICROZIDE) 12.5 MG capsule  Daily        01/18/23 1036              Michelle Piper, [place] 01/18/23 1037    Terald Sleeper,  MD 01/18/23 1410

## 2024-01-09 IMAGING — CR DG CHEST 2V
1 series · 2 of 2 positions shown · non-contrast
Comparison: None.

CLINICAL DATA: Central chest pain

EXAM:
CHEST - 2 VIEW

[Series 1: w chest pa · 0.14mm/px · 2 of 2 slices shown]
[im 1/2]
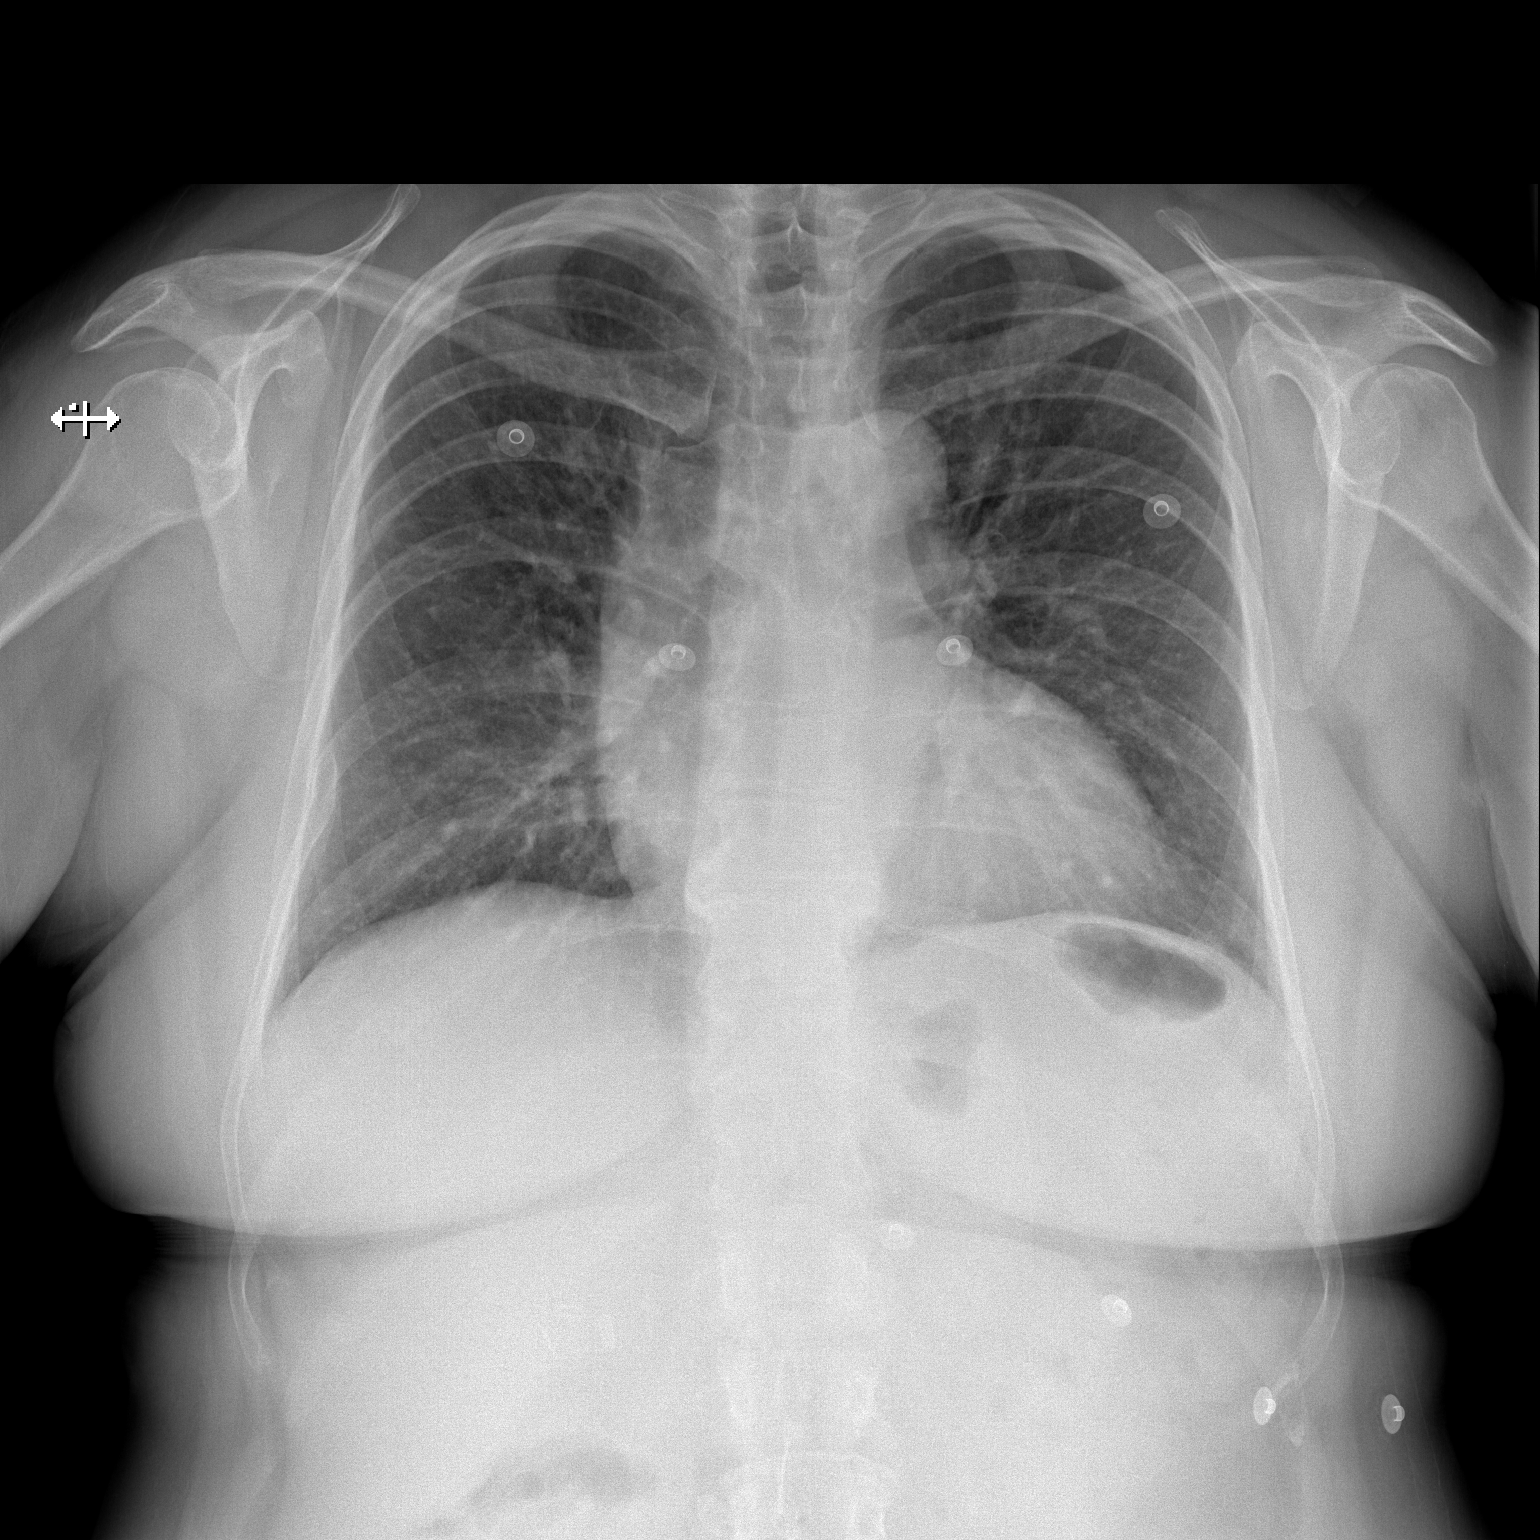
[im 2/2]
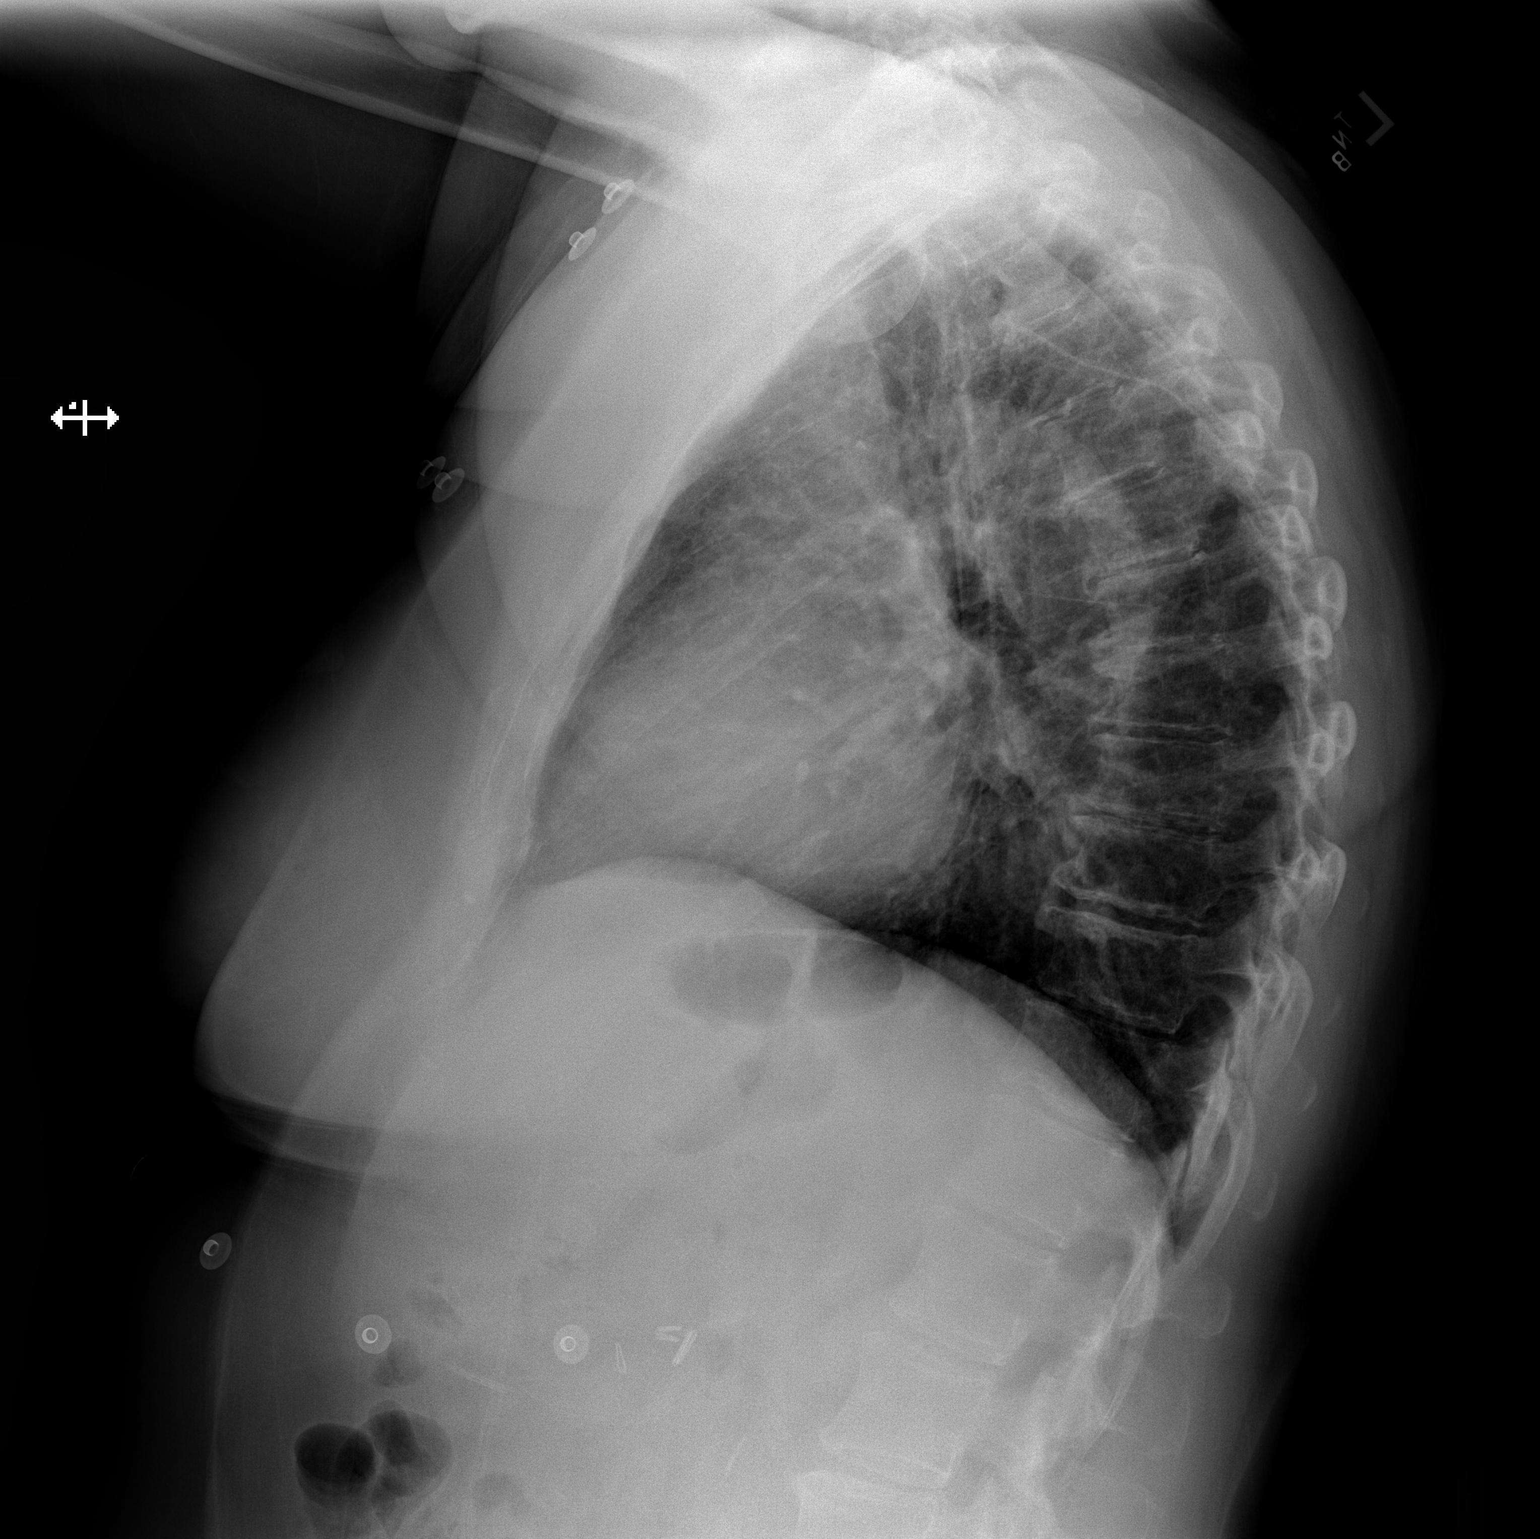

[2 of 2 positions shown; findings below may reference images not displayed]

FINDINGS: Normal mediastinum and cardiac silhouette. Normal pulmonary
vasculature. No evidence of effusion, infiltrate, or pneumothorax.
No acute bony abnormality.
IMPRESSION: No acute cardiopulmonary process.

## 2024-02-07 IMAGING — CT CT CARDIAC CORONARY ARTERY CALCIUM SCORE
3 series · 14 of 20 positions shown, 16 images · non-contrast
Comparison: CT 11/20/2005

Addendum:
CLINICAL DATA: Risk stratification

EXAM:
Coronary Calcium Score
TECHNIQUE: The patient was scanned on a Siemens Somatom go.Top Scanner. Axial
non-contrast 3 mm slices were carried out through the heart. The
data set was analyzed on a dedicated work station and scored using
the Agatson method.

[Series 2: sa36 calcium scoring 3.00 · axial · 0.36mm/px · z∈[-1116,-1035]mm · 4 of 46 slices shown]
[im 10/46  vessel]
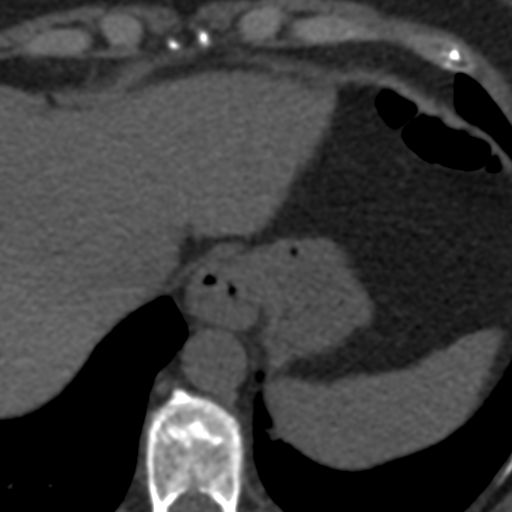
[im 19/46  vessel]
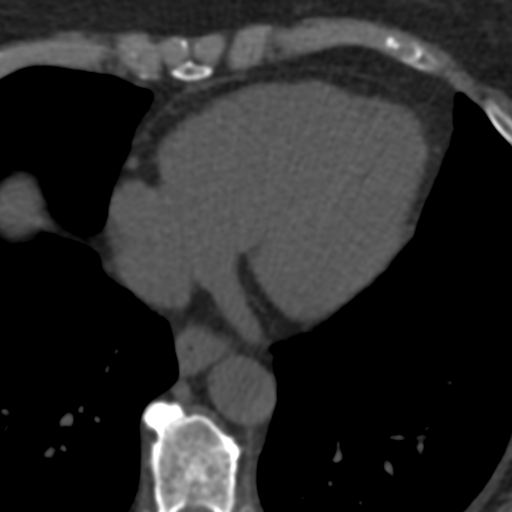
[im 28/46  vessel]
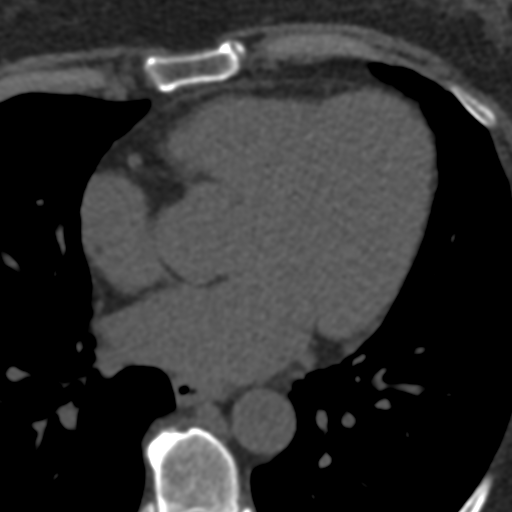
[im 37/46  vessel]
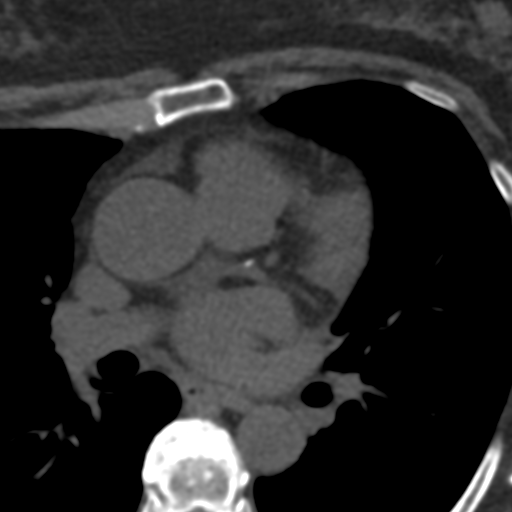

[Series 5: full fov st calcium scoring 3.00 · axial · 0.68mm/px · z∈[-1122,-1032]mm · 5 of 46 slices shown, 7 images]
[im 8/46  vessel]
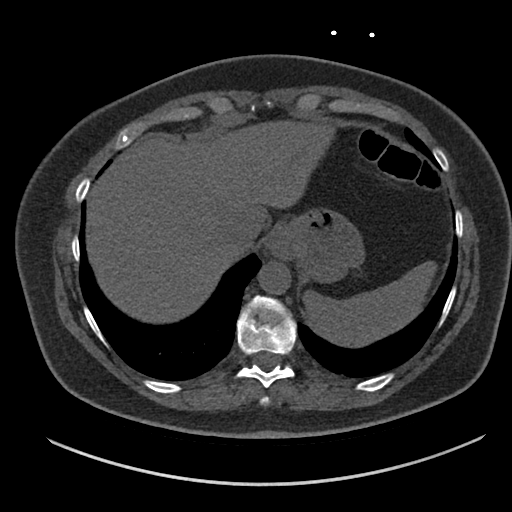
[im 8/46  lung]
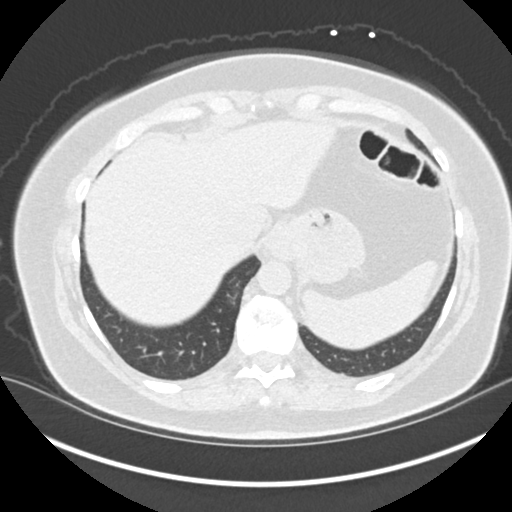
[im 16/46  vessel]
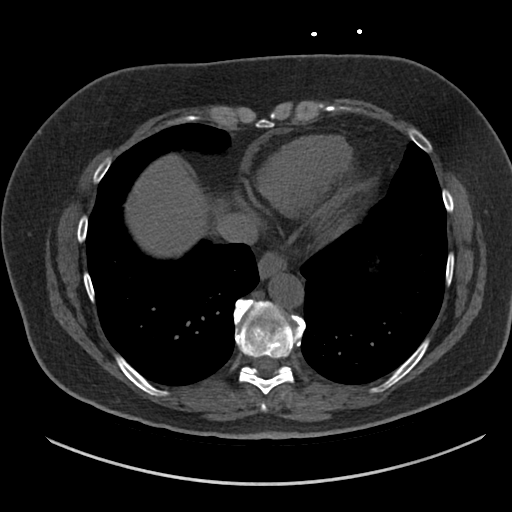
[im 23/46  vessel]
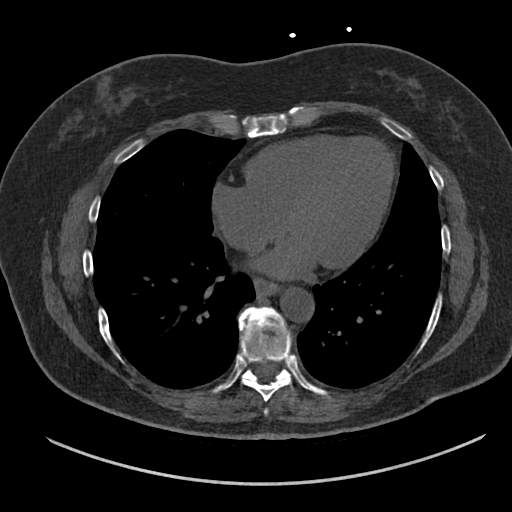
[im 31/46  vessel]
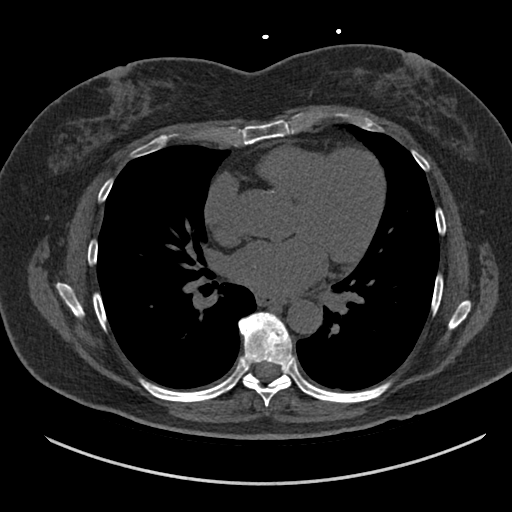
[im 38/46  vessel]
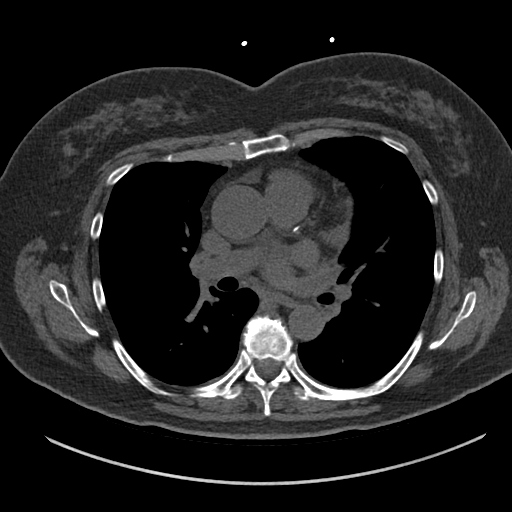
[im 38/46  lung]
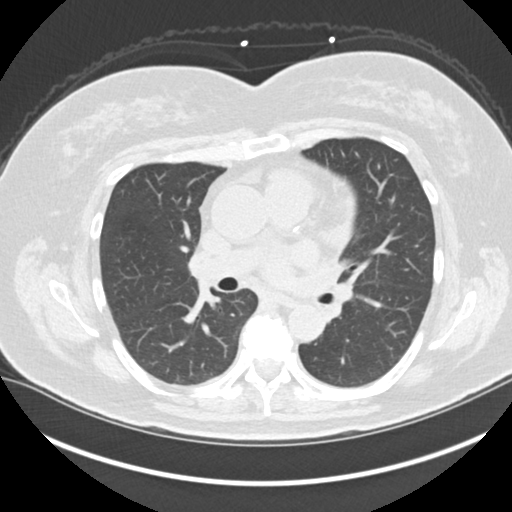

[Series 10: full fov lungs calcium scoring 3.00 ax · axial · 0.68mm/px · z∈[-1122,-1032]mm · 5 of 46 slices shown]
[im 8/46  vessel]
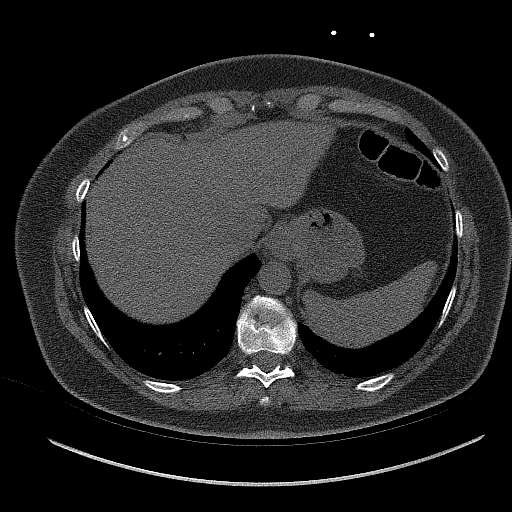
[im 16/46  vessel]
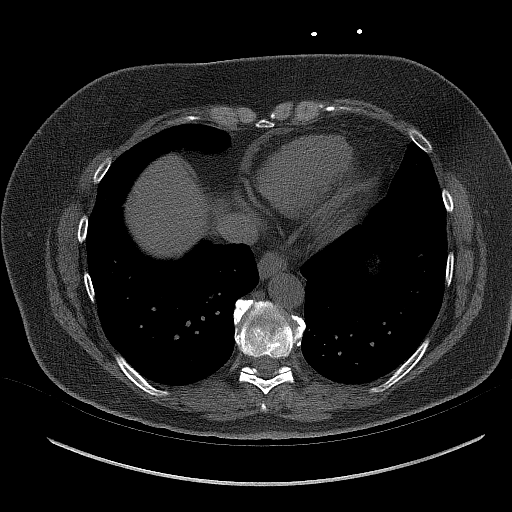
[im 23/46  vessel]
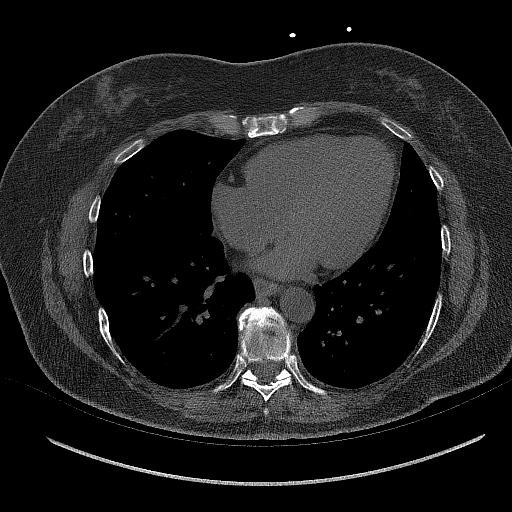
[im 31/46  vessel]
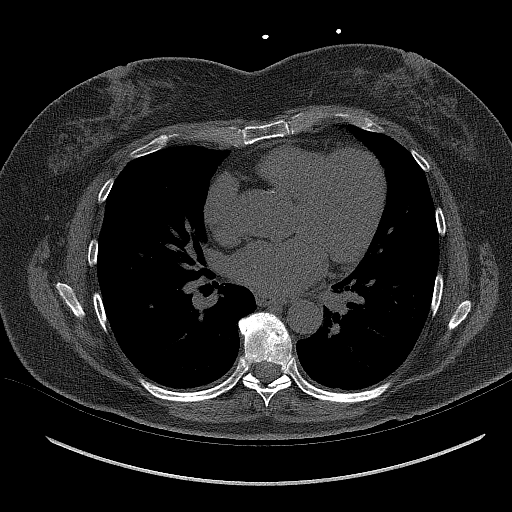
[im 38/46  vessel]
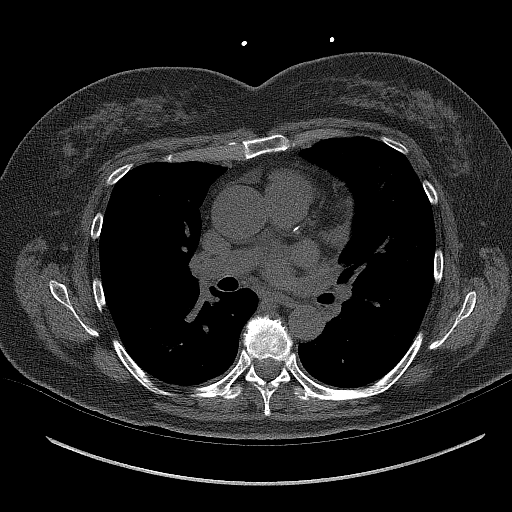

[14 of 20 positions shown; findings below may reference images not displayed]

FINDINGS: Non-cardiac: See separate report from [REDACTED].

Ascending Aorta: Normal size

Pericardium: Normal

Coronary arteries: Normal origin of left and right coronary
arteries. Distribution of arterial calcifications if present, as
noted below;

LM 0

LAD

LCx 0

RCA 0

Total

IMPRESSION AND RECOMMENDATION:
1. Coronary calcium score of 49.4. This was 83rd percentile for age
and sex matched control.

2. CAC 1-99 in LAD. RANTONA BHEBHE1/N1.

3. Continue heart healthy lifestyle and risk factor modification.

4. Consider statin due to percentile being >75.

Vivek Mcmichael

EXAM:
OVER-READ INTERPRETATION  CT CHEST

The following report is an over-read performed by radiologist Dr.
does not include interpretation of cardiac or coronary anatomy or
pathology. The coronary calcium score interpretation by the
cardiologist is attached.
FINDINGS: Images of the upper abdomen are unremarkable. Small hiatal hernia.
Visualized mediastinal structures are normal. 3 mm pleural-based
nodule in the right middle lobe base on sequence 2 image [DATE] be
new since [DATE] mm pleural-based nodule in the right lower lobe on
sequence 10 image 10. Small patchy densities in the medial left
lower lobe are suggestive for scarring or atelectasis. No large
areas of airspace disease or consolidation in the lungs. No large
pleural effusions. 2 mm nodular density near the left major fissure
in the left lower lobe on sequence 10 image 10. Evidence for old
right rib fracture.
IMPRESSION: 1. No acute extracardiac findings.
2. Small lung nodules, largest measuring 4 mm. These small nodular
densities are indeterminate. No follow-up needed if patient is
low-risk (and has no known or suspected primary neoplasm).
Non-contrast chest CT can be considered in 12 months if patient is
high-risk. This recommendation follows the consensus statement:
Guidelines for Management of Incidental Pulmonary Nodules Detected
[DATE].
3. Small hiatal hernia.

*** End of Addendum ***
FINDINGS: Non-cardiac: See separate report from [REDACTED].

Ascending Aorta: Normal size

Pericardium: Normal

Coronary arteries: Normal origin of left and right coronary
arteries. Distribution of arterial calcifications if present, as
noted below;

LM 0

LAD

LCx 0

RCA 0

Total

IMPRESSION AND RECOMMENDATION:
1. Coronary calcium score of 49.4. This was 83rd percentile for age
and sex matched control.

2. CAC 1-99 in LAD. RANTONA BHEBHE1/N1.

3. Continue heart healthy lifestyle and risk factor modification.

4. Consider statin due to percentile being >75.

Vivek Mcmichael
